# Patient Record
Sex: Female | Born: 1935 | Race: White | Hispanic: No | Smoking: Never smoker
Health system: Southern US, Community
[De-identification: ages and names within clinical notes are randomized; demographics above are authoritative.]

## PROBLEM LIST (undated history)

## (undated) DIAGNOSIS — I10 Essential (primary) hypertension: Secondary | ICD-10-CM

## (undated) DIAGNOSIS — C19 Malignant neoplasm of rectosigmoid junction: Secondary | ICD-10-CM

## (undated) DIAGNOSIS — C801 Malignant (primary) neoplasm, unspecified: Secondary | ICD-10-CM

## (undated) DIAGNOSIS — I639 Cerebral infarction, unspecified: Secondary | ICD-10-CM

## (undated) DIAGNOSIS — Z87442 Personal history of urinary calculi: Secondary | ICD-10-CM

## (undated) DIAGNOSIS — E785 Hyperlipidemia, unspecified: Secondary | ICD-10-CM

## (undated) HISTORY — PX: COLON SURGERY: SHX602

## (undated) HISTORY — PX: ABDOMINAL HYSTERECTOMY: SHX81

## (undated) HISTORY — PX: CHOLECYSTECTOMY: SHX55

---

## 2015-09-08 HISTORY — PX: COLOSTOMY: SHX63

## 2017-01-10 ENCOUNTER — Inpatient Hospital Stay
Admission: EM | Admit: 2017-01-10 | Discharge: 2017-01-14 | DRG: 872 | Disposition: A | Discharge status: Skilled Nursing Facility | Payer: Medicare Other | Attending: Internal Medicine | Admitting: Internal Medicine

## 2017-01-10 ENCOUNTER — Emergency Department: Payer: Medicare Other

## 2017-01-10 ENCOUNTER — Inpatient Hospital Stay: Payer: Medicare Other

## 2017-01-10 DIAGNOSIS — R7989 Other specified abnormal findings of blood chemistry: Secondary | ICD-10-CM

## 2017-01-10 DIAGNOSIS — Z79899 Other long term (current) drug therapy: Secondary | ICD-10-CM | POA: Diagnosis not present

## 2017-01-10 DIAGNOSIS — Z881 Allergy status to other antibiotic agents status: Secondary | ICD-10-CM

## 2017-01-10 DIAGNOSIS — I2699 Other pulmonary embolism without acute cor pulmonale: Secondary | ICD-10-CM

## 2017-01-10 DIAGNOSIS — N200 Calculus of kidney: Secondary | ICD-10-CM

## 2017-01-10 DIAGNOSIS — E785 Hyperlipidemia, unspecified: Secondary | ICD-10-CM | POA: Diagnosis present

## 2017-01-10 DIAGNOSIS — N136 Pyonephrosis: Secondary | ICD-10-CM | POA: Diagnosis present

## 2017-01-10 DIAGNOSIS — R4182 Altered mental status, unspecified: Secondary | ICD-10-CM | POA: Diagnosis present

## 2017-01-10 DIAGNOSIS — R000 Tachycardia, unspecified: Secondary | ICD-10-CM

## 2017-01-10 DIAGNOSIS — N132 Hydronephrosis with renal and ureteral calculous obstruction: Secondary | ICD-10-CM

## 2017-01-10 DIAGNOSIS — A419 Sepsis, unspecified organism: Secondary | ICD-10-CM

## 2017-01-10 DIAGNOSIS — I4891 Unspecified atrial fibrillation: Secondary | ICD-10-CM | POA: Diagnosis present

## 2017-01-10 DIAGNOSIS — A4159 Other Gram-negative sepsis: Principal | ICD-10-CM | POA: Diagnosis present

## 2017-01-10 DIAGNOSIS — N131 Hydronephrosis with ureteral stricture, not elsewhere classified: Secondary | ICD-10-CM

## 2017-01-10 DIAGNOSIS — N12 Tubulo-interstitial nephritis, not specified as acute or chronic: Secondary | ICD-10-CM

## 2017-01-10 DIAGNOSIS — N390 Urinary tract infection, site not specified: Secondary | ICD-10-CM | POA: Diagnosis present

## 2017-01-10 DIAGNOSIS — Z7982 Long term (current) use of aspirin: Secondary | ICD-10-CM

## 2017-01-10 DIAGNOSIS — Z85048 Personal history of other malignant neoplasm of rectum, rectosigmoid junction, and anus: Secondary | ICD-10-CM | POA: Diagnosis not present

## 2017-01-10 DIAGNOSIS — J9811 Atelectasis: Secondary | ICD-10-CM | POA: Diagnosis present

## 2017-01-10 DIAGNOSIS — Z8673 Personal history of transient ischemic attack (TIA), and cerebral infarction without residual deficits: Secondary | ICD-10-CM | POA: Diagnosis not present

## 2017-01-10 DIAGNOSIS — N10 Acute tubulo-interstitial nephritis: Secondary | ICD-10-CM

## 2017-01-10 DIAGNOSIS — R778 Other specified abnormalities of plasma proteins: Secondary | ICD-10-CM

## 2017-01-10 HISTORY — DX: Malignant neoplasm of rectosigmoid junction: C19

## 2017-01-10 HISTORY — DX: Malignant (primary) neoplasm, unspecified: C80.1

## 2017-01-10 HISTORY — PX: IR NEPHROSTOMY PLACEMENT RIGHT: IMG6064

## 2017-01-10 HISTORY — DX: Cerebral infarction, unspecified: I63.9

## 2017-01-10 LAB — URINALYSIS, COMPLETE (UACMP) WITH MICROSCOPIC
BILIRUBIN URINE: NEGATIVE
Glucose, UA: 50 mg/dL — AB
KETONES UR: NEGATIVE mg/dL
Nitrite: POSITIVE — AB
PROTEIN: 100 mg/dL — AB
SQUAMOUS EPITHELIAL / LPF: NONE SEEN
Specific Gravity, Urine: 1.019 (ref 1.005–1.030)
pH: 6 (ref 5.0–8.0)

## 2017-01-10 LAB — CBC WITH DIFFERENTIAL/PLATELET
BASOS PCT: 0 %
Basophils Absolute: 0 10*3/uL (ref 0–0.1)
Eosinophils Absolute: 0 10*3/uL (ref 0–0.7)
Eosinophils Relative: 0 %
HEMATOCRIT: 41.2 % (ref 35.0–47.0)
Hemoglobin: 13.7 g/dL (ref 12.0–16.0)
LYMPHS ABS: 0.9 10*3/uL — AB (ref 1.0–3.6)
LYMPHS PCT: 6 %
MCH: 30.5 pg (ref 26.0–34.0)
MCHC: 33.3 g/dL (ref 32.0–36.0)
MCV: 91.7 fL (ref 80.0–100.0)
MONO ABS: 1.1 10*3/uL — AB (ref 0.2–0.9)
MONOS PCT: 7 %
NEUTROS ABS: 14.7 10*3/uL — AB (ref 1.4–6.5)
Neutrophils Relative %: 87 %
Platelets: 140 10*3/uL — ABNORMAL LOW (ref 150–440)
RBC: 4.49 MIL/uL (ref 3.80–5.20)
RDW: 14.9 % — AB (ref 11.5–14.5)
WBC: 16.8 10*3/uL — ABNORMAL HIGH (ref 3.6–11.0)

## 2017-01-10 LAB — COMPREHENSIVE METABOLIC PANEL
ALBUMIN: 3.5 g/dL (ref 3.5–5.0)
ALT: 35 U/L (ref 14–54)
AST: 45 U/L — AB (ref 15–41)
Alkaline Phosphatase: 48 U/L (ref 38–126)
Anion gap: 8 (ref 5–15)
BUN: 28 mg/dL — AB (ref 6–20)
CHLORIDE: 101 mmol/L (ref 101–111)
CO2: 27 mmol/L (ref 22–32)
CREATININE: 1.17 mg/dL — AB (ref 0.44–1.00)
Calcium: 8.8 mg/dL — ABNORMAL LOW (ref 8.9–10.3)
GFR calc Af Amer: 49 mL/min — ABNORMAL LOW (ref >60)
GFR calc non Af Amer: 43 mL/min — ABNORMAL LOW (ref >60)
GLUCOSE: 170 mg/dL — AB (ref 65–99)
POTASSIUM: 3.9 mmol/L (ref 3.5–5.1)
SODIUM: 136 mmol/L (ref 135–145)
Total Bilirubin: 1.7 mg/dL — ABNORMAL HIGH (ref 0.3–1.2)
Total Protein: 7.2 g/dL (ref 6.5–8.1)

## 2017-01-10 LAB — PROTIME-INR
INR: 1.17
Prothrombin Time: 15 seconds (ref 11.4–15.2)

## 2017-01-10 LAB — TROPONIN I: TROPONIN I: 0.03 ng/mL — AB (ref <0.03)

## 2017-01-10 LAB — LIPASE, BLOOD: LIPASE: 13 U/L (ref 11–51)

## 2017-01-10 LAB — LACTIC ACID, PLASMA: LACTIC ACID, VENOUS: 2.6 mmol/L — AB (ref 0.5–1.9)

## 2017-01-10 LAB — PROCALCITONIN: Procalcitonin: 2.48 ng/mL

## 2017-01-10 MED ORDER — ACETAMINOPHEN 650 MG RE SUPP
650.0000 mg | Freq: Four times a day (QID) | RECTAL | Status: DC | PRN
  Filled 2017-01-10: qty 1

## 2017-01-10 MED ORDER — FERROUS SULFATE 325 (65 FE) MG PO TABS
325.0000 mg | ORAL_TABLET | Freq: Every day | ORAL | Status: DC
  Administered 2017-01-11 – 2017-01-14 (×4): 325 mg via ORAL
  Filled 2017-01-10 (×4): qty 1

## 2017-01-10 MED ORDER — ACETAMINOPHEN 325 MG PO TABS
650.0000 mg | ORAL_TABLET | Freq: Four times a day (QID) | ORAL | Status: DC | PRN
  Administered 2017-01-11: 18:00:00 650 mg via ORAL
  Filled 2017-01-10 (×2): qty 2

## 2017-01-10 MED ORDER — AZTREONAM 2 G IJ SOLR
2.0000 g | Freq: Once | INTRAMUSCULAR | Status: AC
  Administered 2017-01-10: 2 g via INTRAVENOUS
  Filled 2017-01-10: qty 2

## 2017-01-10 MED ORDER — ATORVASTATIN CALCIUM 20 MG PO TABS
80.0000 mg | ORAL_TABLET | Freq: Every day | ORAL | Status: DC
  Administered 2017-01-11 – 2017-01-13 (×3): 80 mg via ORAL
  Filled 2017-01-10 (×4): qty 4

## 2017-01-10 MED ORDER — ASPIRIN EC 81 MG PO TBEC
81.0000 mg | DELAYED_RELEASE_TABLET | Freq: Every day | ORAL | Status: DC
  Administered 2017-01-11 – 2017-01-14 (×4): 81 mg via ORAL
  Filled 2017-01-10 (×4): qty 1

## 2017-01-10 MED ORDER — LEVOFLOXACIN IN D5W 750 MG/150ML IV SOLN
750.0000 mg | INTRAVENOUS | Status: DC
  Administered 2017-01-12: 16:00:00 750 mg via INTRAVENOUS
  Filled 2017-01-10: qty 150

## 2017-01-10 MED ORDER — SODIUM CHLORIDE 0.9 % IV SOLN
INTRAVENOUS | Status: DC
  Administered 2017-01-11: 06:00:00 via INTRAVENOUS

## 2017-01-10 MED ORDER — ACETAMINOPHEN 500 MG PO TABS
1000.0000 mg | ORAL_TABLET | Freq: Once | ORAL | Status: AC
  Administered 2017-01-10: 650 mg via ORAL
  Administered 2017-01-10: 1000 mg via ORAL
  Filled 2017-01-10: qty 2

## 2017-01-10 MED ORDER — ONDANSETRON HCL 4 MG PO TABS: 4.0000 mg | ORAL_TABLET | Freq: Four times a day (QID) | ORAL | Status: DC | PRN

## 2017-01-10 MED ORDER — MIDAZOLAM HCL 5 MG/5ML IJ SOLN
INTRAMUSCULAR | Status: AC
  Filled 2017-01-10: qty 5

## 2017-01-10 MED ORDER — VANCOMYCIN HCL IN DEXTROSE 1-5 GM/200ML-% IV SOLN
1000.0000 mg | Freq: Once | INTRAVENOUS | Status: AC
  Administered 2017-01-10: 1000 mg via INTRAVENOUS
  Filled 2017-01-10: qty 200

## 2017-01-10 MED ORDER — FENTANYL CITRATE (PF) 100 MCG/2ML IJ SOLN
INTRAMUSCULAR | Status: AC
  Filled 2017-01-10: qty 2

## 2017-01-10 MED ORDER — LORATADINE 10 MG PO TABS
10.0000 mg | ORAL_TABLET | Freq: Every day | ORAL | Status: DC
  Administered 2017-01-11 – 2017-01-14 (×4): 10 mg via ORAL
  Filled 2017-01-10 (×4): qty 1

## 2017-01-10 MED ORDER — ONDANSETRON HCL 4 MG/2ML IJ SOLN
INTRAMUSCULAR | Status: AC | PRN
  Administered 2017-01-10: 4 mg via INTRAVENOUS

## 2017-01-10 MED ORDER — LEVOFLOXACIN IN D5W 750 MG/150ML IV SOLN
750.0000 mg | Freq: Once | INTRAVENOUS | Status: AC
  Administered 2017-01-10: 750 mg via INTRAVENOUS
  Filled 2017-01-10: qty 150

## 2017-01-10 MED ORDER — ONDANSETRON HCL 4 MG/2ML IJ SOLN
INTRAMUSCULAR | Status: AC
  Filled 2017-01-10: qty 2

## 2017-01-10 MED ORDER — SODIUM CHLORIDE 0.9 % IV BOLUS (SEPSIS)
1000.0000 mL | Freq: Once | INTRAVENOUS | Status: AC
  Administered 2017-01-10: 1000 mL via INTRAVENOUS

## 2017-01-10 MED ORDER — IOPAMIDOL (ISOVUE-300) INJECTION 61%
50.0000 mL | Freq: Once | INTRAVENOUS | Status: AC | PRN
  Administered 2017-01-10: 15 mL via INTRAVENOUS

## 2017-01-10 MED ORDER — FENTANYL CITRATE (PF) 100 MCG/2ML IJ SOLN
INTRAMUSCULAR | Status: AC | PRN
  Administered 2017-01-10: 25 ug via INTRAVENOUS

## 2017-01-10 MED ORDER — MIDAZOLAM HCL 2 MG/2ML IJ SOLN
INTRAMUSCULAR | Status: AC | PRN
  Administered 2017-01-10: 0.5 mg via INTRAVENOUS

## 2017-01-10 MED ORDER — ENOXAPARIN SODIUM 40 MG/0.4ML ~~LOC~~ SOLN
40.0000 mg | SUBCUTANEOUS | Status: DC
  Administered 2017-01-11 – 2017-01-13 (×3): 40 mg via SUBCUTANEOUS
  Filled 2017-01-10 (×3): qty 0.4

## 2017-01-10 MED ORDER — IOPAMIDOL (ISOVUE-300) INJECTION 61%
75.0000 mL | Freq: Once | INTRAVENOUS | Status: AC | PRN
  Administered 2017-01-10: 75 mL via INTRAVENOUS

## 2017-01-10 MED ORDER — MORPHINE SULFATE (PF) 2 MG/ML IV SOLN: 2.0000 mg | INTRAVENOUS | Status: DC | PRN

## 2017-01-10 MED ORDER — VITAMIN C 500 MG PO TABS
500.0000 mg | ORAL_TABLET | Freq: Every day | ORAL | Status: DC
  Administered 2017-01-11 – 2017-01-14 (×4): 500 mg via ORAL
  Filled 2017-01-10 (×4): qty 1

## 2017-01-10 MED ORDER — ONDANSETRON HCL 4 MG/2ML IJ SOLN
4.0000 mg | Freq: Four times a day (QID) | INTRAMUSCULAR | Status: DC | PRN
  Administered 2017-01-11: 4 mg via INTRAVENOUS
  Filled 2017-01-10: qty 2

## 2017-01-10 MED ORDER — ADULT MULTIVITAMIN W/MINERALS CH
1.0000 | ORAL_TABLET | Freq: Every day | ORAL | Status: DC
  Administered 2017-01-11 – 2017-01-14 (×4): 1 via ORAL
  Filled 2017-01-10 (×4): qty 1

## 2017-01-10 NOTE — Procedures (Signed)
Interventional Radiology Procedure Note  Procedure:  Right percutaneous nephrostomy  Complications: None  Estimated Blood Loss: < 10 mL  Overt pus in collecting system c/w pyonephrosis.  Sample sent for culture.  10 Fr PCN placed and formed in renal pelvis.  Connected to gravity bag drainage.  Venetia Night. Kathlene Cote, M.D Pager:  [phone]

## 2017-01-10 NOTE — ED Notes (Signed)
Patient taken to [facility] by this RN. Report given to vascular RN at bedside.

## 2017-01-10 NOTE — H&P (Signed)
[facility] [facility] [place] NAME: Megan Hamilton    MR#:  [id number]  DATE OF BIRTH:  01-04-1933  DATE OF ADMISSION:  01/10/2017  PRIMARY CARE PHYSICIAN: Verita Lamb, NP   REQUESTING/REFERRING PHYSICIAN: Dr. Rip Harbour  CHIEF COMPLAINT:   Chief Complaint  Patient presents with  . Altered Mental Status    HISTORY OF PRESENT ILLNESS:  Megan Hamilton  is a 83 y.o. female with a known history of Colorectal cancer, previous history of CVA, also the hospital brought in by her daughter due to altered mental status, lethargy. As per the daughter gives most of the history patient was in her usual state of health until yesterday she had 2 episodes of nausea vomiting. Patient rested most of the day yesterday and was quite lethargic, she was also very weak and lethargic today and therefore they were concerned and brought her to the ER for further evaluation. Patient was noted to be septic secondary to acute pyelonephritis and also noted to have acute right-sided nephrolithiasis. Hospitalist services were contacted further treatment and evaluation. Patient denies any flank abdominal pain, hematuria, chest pain, shortness of breath or any other associated symptoms presently.  PAST MEDICAL HISTORY:   Past Medical History:  Diagnosis Date  . Cancer [facility])    colorectal 2017  . Colorectal cancer ([place])   . Stroke [facility])     PAST SURGICAL HISTORY:   Past Surgical History:  Procedure Laterality Date  . ABDOMINAL HYSTERECTOMY    . CHOLECYSTECTOMY    . COLON SURGERY      SOCIAL HISTORY:   Social History  Substance Use Topics  . Smoking status: Never Smoker  . Smokeless tobacco: Never Used  . Alcohol use No    FAMILY HISTORY:   Family History  Problem Relation Age of Onset  . Diabetes Mother   . Stroke Father     DRUG ALLERGIES:   Allergies  Allergen Reactions  . Cephalexin Hives  . Quinine Derivatives     REVIEW OF SYSTEMS:   Review of  Systems  Constitutional: Positive for malaise/fatigue. Negative for fever and weight loss.  HENT: Negative for congestion, nosebleeds and tinnitus.   Eyes: Negative for blurred vision, double vision and redness.  Respiratory: Negative for cough, hemoptysis and shortness of breath.   Cardiovascular: Negative for chest pain, orthopnea, leg swelling and PND.  Gastrointestinal: Positive for nausea and vomiting. Negative for abdominal pain, diarrhea and melena.  Genitourinary: Negative for dysuria, hematuria and urgency.  Musculoskeletal: Negative for falls and joint pain.  Neurological: Positive for weakness. Negative for dizziness, tingling, sensory change, focal weakness, seizures and headaches.  Endo/Heme/Allergies: Negative for polydipsia. Does not bruise/bleed easily.  Psychiatric/Behavioral: Negative for depression and memory loss. The patient is not nervous/anxious.     MEDICATIONS AT HOME:   Prior to Admission medications   Medication Sig Start Date End Date Taking? Authorizing Provider  acetaminophen (TYLENOL) 500 MG tablet Take 1,000 mg by mouth every 6 (six) hours as needed for fever.   Yes [provider]  Ascorbic Acid (VITAMIN C WITH ROSE HIPS) 500 MG tablet Take 500 mg by mouth daily.   Yes [provider]  aspirin EC 81 MG tablet Take 81 mg by mouth daily.   Yes [provider]  atorvastatin (LIPITOR) 80 MG tablet Take 80 mg by mouth daily.   Yes [provider]  diphenhydramine-acetaminophen (TYLENOL PM) 25-500 MG TABS tablet Take 1 tablet by mouth at bedtime.  Yes [provider]  ferrous sulfate 325 (65 FE) MG tablet Take 325 mg by mouth daily with breakfast.   Yes [provider]  loratadine (CLARITIN) 10 MG tablet Take 10 mg by mouth daily.   Yes [provider]  Multiple Vitamin (MULTIVITAMIN) tablet Take 1 tablet by mouth daily.   Yes [provider]      VITAL SIGNS:  Blood pressure 125/67,  pulse 83, temperature 98.3 F (36.8 C), temperature source Oral, resp. rate 17, weight 88.9 kg (196 lb), SpO2 97 %.  PHYSICAL EXAMINATION:  Physical Exam  GENERAL:  83 y.o.-year-old patient lying in bed in no acute distress.  EYES: Pupils equal, round, reactive to light and accommodation. No scleral icterus. Extraocular muscles intact.  HEENT: Head atraumatic, normocephalic. Oropharynx and nasopharynx clear. No oropharyngeal erythema, moist oral mucosa  NECK:  Supple, no jugular venous distention. No thyroid enlargement, no tenderness.  LUNGS: Normal breath sounds bilaterally, no wheezing, rales, rhonchi. No use of accessory muscles of respiration.  CARDIOVASCULAR: S1, S2 RRR. II/VI SEM at LSB, No rubs, gallops, clicks.  ABDOMEN: Soft, nontender, nondistended. Bowel sounds present. No organomegaly or mass. Midabdominal scar with colostomy in place. EXTREMITIES: No pedal edema, cyanosis, or clubbing. + 2 pedal & radial pulses b/l.   NEUROLOGIC: Cranial nerves II through XII are intact. No focal Motor or sensory deficits appreciated b/l. Globally weak.  PSYCHIATRIC: The patient is alert and oriented x 3.  SKIN: No obvious rash, lesion, or ulcer.   LABORATORY PANEL:   CBC  Recent Labs Lab 01/10/17 1648  WBC 16.8*  HGB 13.7  HCT 41.2  PLT 140*   ------------------------------------------------------------------------------------------------------------------  Chemistries   Recent Labs Lab 01/10/17 1648  NA 136  K 3.9  CL 101  CO2 27  GLUCOSE 170*  BUN 28*  CREATININE 1.17*  CALCIUM 8.8*  AST 45*  ALT 35  ALKPHOS 48  BILITOT 1.7*   ------------------------------------------------------------------------------------------------------------------  Cardiac Enzymes  Recent Labs Lab 01/10/17 1701  TROPONINI 0.03*   ------------------------------------------------------------------------------------------------------------------  RADIOLOGY:  Dg Chest 1  View  Result Date: 01/10/2017 CLINICAL DATA:  Altered mental status.  Sepsis.  Colorectal cancer. EXAM: CHEST 1 VIEW COMPARISON:  None. FINDINGS: Right hemidiaphragm elevation. Mildly degraded exam due to AP portable technique and patient body habitus. The Chin overlies the apices. Patient rotated to the right. Cardiomegaly accentuated by AP portable technique. Atherosclerosis in the transverse aorta. No pleural effusion or pneumothorax. No congestive failure. Mild left base scarring laterally. IMPRESSION: No acute findings. Cardiomegaly without congestive failure. Low lung volumes. Aortic atherosclerosis. Electronically Signed   By: Abigail Miyamoto M.D.   On: 01/10/2017 17:17   Ct Abdomen Pelvis W Contrast  Result Date: 01/10/2017 CLINICAL DATA:  Acute onset of altered mental status. Abdominal sepsis. Initial encounter. EXAM: CT ABDOMEN AND PELVIS WITH CONTRAST TECHNIQUE: Multidetector CT imaging of the abdomen and pelvis was performed using the standard protocol following bolus administration of intravenous contrast. CONTRAST:  69mL ISOVUE-300 IOPAMIDOL (ISOVUE-300) INJECTION 61% COMPARISON:  None. FINDINGS: Lower chest: Minimal bibasilar atelectasis is noted. A tiny hiatal hernia is seen. The visualized portions of the mediastinum are grossly unremarkable. Hepatobiliary: The liver is unremarkable in appearance. The patient is status post cholecystectomy, with clips noted at the gallbladder fossa. The common bile duct remains normal in caliber. Vague soft tissue inflammation tracks about the inferior aspect of the liver, reflecting the adjacent right renal process. Pancreas: The pancreas is within normal limits. Spleen: The spleen is unremarkable in  appearance. Adrenals/Urinary Tract: The adrenal glands are grossly unremarkable in appearance. There is diffusely heterogeneous enhancement of the right kidney, with right renal enlargement and diffuse wall thickening along the right renal pelvis, compatible with  right-sided pyelonephritis. Surrounding stranding is noted, tracking inferiorly along the right paracolic gutter. A large 1.5 cm stone is noted at the right renal pelvis. There is diffuse wall thickening along the course of the right ureter, reflecting right-sided ureteritis. Minimal right-sided hydronephrosis is noted. The left kidney is grossly unremarkable in appearance. No additional renal or ureteral stones are identified. Stomach/Bowel: The appendix is not well seen. Vague soft tissue inflammation about the cecum appears to reflect the adjacent right ureteral process. The patient's left lower quadrant colostomy is grossly unremarkable in appearance, aside from herniation of a short segment of small bowel into the colostomy site. There is no evidence for bowel obstruction. Minimal diverticulosis is noted along the distal descending colon. Remaining visualized small bowel loops are grossly unremarkable in appearance. The stomach is decompressed and grossly unremarkable. Vascular/Lymphatic: Scattered calcification is seen along the abdominal aorta and its branches. The abdominal aorta is otherwise grossly unremarkable. The inferior vena cava is grossly unremarkable. No retroperitoneal lymphadenopathy is seen. No pelvic sidewall lymphadenopathy is identified. Reproductive: The patient is status post hysterectomy. There is diffuse soft tissue inflammation about the decompressed bladder, raising concern for cystitis. Vague nodular soft tissue density is seen within the presacral region, with associated trace free fluid, raising question for metastatic disease. Other: No additional soft tissue abnormalities are seen. Musculoskeletal: No acute osseous abnormalities are identified. Facet disease is noted at the lower lumbar spine. The visualized musculature is unremarkable in appearance. IMPRESSION: 1. Diffusely heterogeneous enhancement of the right kidney, with right renal enlargement and diffuse wall thickening  along the right renal pelvis, compatible with right-sided pyelonephritis. Large 1.5 cm stone at the right renal pelvis, with minimal right-sided hydronephrosis. Diffuse wall thickening along the right ureter, reflecting right-sided ureteritis. 2. Diffuse soft tissue inflammation about the decompressed bladder, raising concern for cystitis. 3. Vague nodular soft tissue density at the presacral region, with associated trace free fluid, raising question for metastatic disease. Would correlate with the patient's symptoms, and consider PET/CT for further evaluation, as deemed clinically appropriate. 4. Vague soft tissue inflammation tracks about the inferior aspect of the liver, with fluid tracking along the right paracolic gutter, likely reflecting the right renal process. 5. Tiny hiatal hernia noted. 6. Left lower quadrant colostomy is grossly unremarkable in appearance, aside from herniation of a short segment of small bowel into the colostomy site. No evidence of bowel obstruction. 7. Minimal diverticulosis at the distal descending colon, without evidence of diverticulitis. 8. Scattered aortic atherosclerosis. Electronically Signed   By: Garald Balding M.D.   On: 01/10/2017 19:06     IMPRESSION AND PLAN:   83 year old female with past medical history of colorectal cancer status post colostomy, history of previous CVA who presents to the hospital due to altered mental status, weakness lethargy and noted to have sepsis secondary to acute pyelonephritis.  1. Sepsis-patient meets criteria given her leukocytosis, tachycardia, elevated lactic acid and urinalysis positive for urinary tract infection. Source of patient's sepsis is acute pyelonephritis. -In the ER patient received IV aztreonam, Levaquin. I will just continue Levaquin for now. Follow urine, blood cultures. Follow hemodynamics.  2. Acute pyelonephritis-source of patient's sepsis. Secondary to right-sided nephrolithiasis. -Supportive care with IV  fluids, antibiotics, IV Levaquin. -Given the nephrolithiasis urology consult obtained and as per them  given the size of her stone she would benefit from percutaneous drainage rather than cystoscopy and extraction of stone given its large size. -Interventional radiology to plan for percutaneous drainage later tonight.  3. Leukocytosis-secondary to sepsis.  -follow with IV antibiotic therapy.  4. Hyperlipidemia-continue atorvastatin.  5. History of CVA-continue aspirin, statin.  6. History of colorectal cancer-currently in remission.  All the records are reviewed and case discussed with ED provider. Management plans discussed with the patient, family and they are in agreement.  CODE STATUS: Full  TOTAL TIME TAKING CARE OF THIS PATIENT: 45 minutes.    Henreitta Leber M.D on 01/10/2017 at 8:11 PM  Between 7am to 6pm - Pager - [phone]  After 6pm go to [email or web address] - password [email or web address] [facility]  [place] [facility]  Office  [phone]  CC: Primary care physician; Verita Lamb, NP

## 2017-01-10 NOTE — ED Notes (Signed)
Patient transported to CT 

## 2017-01-10 NOTE — Progress Notes (Signed)
Pharmacy Antibiotic Note  Megan Hamilton is a 83 y.o. female admitted on 01/10/2017 with UTI.  Pharmacy has been consulted for Levaquin dosing.  Plan: Levaquin 750 mg IV q 48 hours ordered.   Vancomycin and aztreonam consults d/c per admitting MD H&P.  Height: 5\' 6"  (167.6 cm) Weight: 196 lb (88.9 kg) IBW/kg (Calculated) : 59.3  Temp (24hrs), Avg:99.4 F (37.4 C), Min:98 F (36.7 C), Max:101.8 F (38.8 C)   Recent Labs Lab 01/10/17 1648  WBC 16.8*  CREATININE 1.17*  LATICACIDVEN 2.6*    Estimated Creatinine Clearance: 42.3 mL/min (A) (by C-G formula based on SCr of 1.17 mg/dL (H)).    Allergies  Allergen Reactions  . Cephalexin Hives  . Quinine Derivatives     Antimicrobials this admission: Vancomycin, aztreonam x1 Levaquin 5/6 >>    >>   Dose adjustments this admission:   Microbiology results: 5/6 BCx: pending 5/6 UCx: pending       5/6 UA: LE(+) NO2(+) WBC TNTC 5/6 CXR: no acute disease  Thank you for allowing pharmacy to be a part of this patient's care.  Shalinda Burkholder S 01/10/2017 11:53 PM

## 2017-01-10 NOTE — Sedation Documentation (Signed)
Notified MD of temp 101.8  Telephone order to give tylenol 650 mg given and done

## 2017-01-10 NOTE — ED Provider Notes (Signed)
[facility] Emergency Department Provider Note  ____________________________________________   First MD Initiated Contact with Patient 01/10/17 1658     (approximate)  I have reviewed the triage vital signs and the nursing notes.   HISTORY  Chief Complaint Altered Mental Status  Level V exemption history Limited by the patient's clinical condition  HPI Megan Hamilton is a 83 y.o. female whose family brings her to the emergency department for altered mental status nausea fatigue and diaphoresis for the past 24 hours or so. She's had a bit of a dry cough. She's been nauseated and has not had an appetite eating last half a cup of grits this morning for breakfast. She's had no diarrhea. She is a past medical history ofcolon cancer and is status post resection with a colostomy in place. She denies chest pain. She denies rhinorrhea or sore throat. She is passing flatus. She has never had a kidney stone before.   Past Medical History:  Diagnosis Date  . Cancer [facility])    colorectal 2017  . Stroke [facility])     There are no active problems to display for this patient.   Past Surgical History:  Procedure Laterality Date  . ABDOMINAL HYSTERECTOMY    . CHOLECYSTECTOMY    . COLON SURGERY      Prior to Admission medications   Medication Sig Start Date End Date Taking? Authorizing Provider  acetaminophen (TYLENOL) 500 MG tablet Take 1,000 mg by mouth every 6 (six) hours as needed for fever.   Yes [provider]  Ascorbic Acid (VITAMIN C WITH ROSE HIPS) 500 MG tablet Take 500 mg by mouth daily.   Yes [provider]  aspirin EC 81 MG tablet Take 81 mg by mouth daily.   Yes [provider]  atorvastatin (LIPITOR) 80 MG tablet Take 80 mg by mouth daily.   Yes [provider]  diphenhydramine-acetaminophen (TYLENOL PM) 25-500 MG TABS tablet Take 1 tablet by mouth at bedtime.   Yes [provider]  ferrous sulfate 325 (65  FE) MG tablet Take 325 mg by mouth daily with breakfast.   Yes [provider]  loratadine (CLARITIN) 10 MG tablet Take 10 mg by mouth daily.   Yes [provider]  Multiple Vitamin (MULTIVITAMIN) tablet Take 1 tablet by mouth daily.   Yes [provider]    Allergies Cephalexin and Quinine derivatives  No family history on file.  Social History Social History  Substance Use Topics  . Smoking status: Never Smoker  . Smokeless tobacco: Never Used  . Alcohol use Not on file    Review of Systems Level V exemption history Limited by the patient's clinical condition 10-point ROS otherwise negative.  ____________________________________________   PHYSICAL EXAM:  VITAL SIGNS: ED Triage Vitals  Enc Vitals Group     BP 01/10/17 1639 (!) 156/67     Pulse Rate 01/10/17 1639 88     Resp 01/10/17 1639 20     Temp 01/10/17 1639 (!) 101.3 F (38.5 C)     Temp Source 01/10/17 1639 Oral     SpO2 01/10/17 1639 93 %     Weight 01/10/17 1636 150 lb (68 kg)     Height --      Head Circumference --      Peak Flow --      Pain Score 01/10/17 1636 0     Pain Loc --      Pain Edu? --  Excl. in [place]? --     Constitutional: Confused diaphoretic ill-appearing with elevated respiratory rate Eyes: PERRL EOMI. Head: Atraumatic. Nose: No congestion/rhinnorhea. Mouth/Throat: No trismus Neck: No stridor.   Cardiovascular: Normal rate, regular rhythm. Grossly normal heart sounds.  Good peripheral circulation. Respiratory: Normal respiratory effort.  No retractions. Lungs CTAB and moving good air Gastrointestinal: Obese soft mild diffuse tenderness without focality stoma in place pink productive patent No costovertebral tenderness Musculoskeletal: No lower extremity edema   Neurologic:  Normal speech and language. No gross focal neurologic deficits are appreciated. Skin:  Pale and diaphoretic.     ____________________________________________    DIFFERENTIAL  Sepsis, pyelonephritis, urinary tract infection, bacteremia, pneumonia, abdominal catastrophe ____________________________________________   LABS (all labs ordered are listed, but only abnormal results are displayed)  Labs Reviewed  COMPREHENSIVE METABOLIC PANEL - Abnormal; Notable for the following:       Result Value   Glucose, Bld 170 (*)    BUN 28 (*)    Creatinine, Ser 1.17 (*)    Calcium 8.8 (*)    AST 45 (*)    Total Bilirubin 1.7 (*)    GFR calc non Af Amer 43 (*)    GFR calc Af Amer 49 (*)    All other components within normal limits  LACTIC ACID, PLASMA - Abnormal; Notable for the following:    Lactic Acid, Venous 2.6 (*)    All other components within normal limits  CBC WITH DIFFERENTIAL/PLATELET - Abnormal; Notable for the following:    WBC 16.8 (*)    RDW 14.9 (*)    Platelets 140 (*)    Neutro Abs 14.7 (*)    Lymphs Abs 0.9 (*)    Monocytes Absolute 1.1 (*)    All other components within normal limits  URINALYSIS, COMPLETE (UACMP) WITH MICROSCOPIC - Abnormal; Notable for the following:    Color, Urine AMBER (*)    APPearance CLOUDY (*)    Glucose, UA 50 (*)    Hgb urine dipstick SMALL (*)    Protein, ur 100 (*)    Nitrite POSITIVE (*)    Leukocytes, UA LARGE (*)    Bacteria, UA MANY (*)    All other components within normal limits  TROPONIN I - Abnormal; Notable for the following:    Troponin I 0.03 (*)    All other components within normal limits  CULTURE, BLOOD (ROUTINE X 2)  CULTURE, BLOOD (ROUTINE X 2)  URINE CULTURE  PROTIME-INR  LIPASE, BLOOD  PROCALCITONIN  LACTIC ACID, PLASMA    Elevated white count and pro-calcitonin consistent with infection elevated troponin likely secondary to type II non-ST elevation myocardial infarction and not true myocardial ischemia __________________________________________  EKG  ED ECG REPORT I, Darel Hong, the attending physician, personally viewed and interpreted this ECG.  Date:  01/10/2017 Rate: 88 Rhythm: normal sinus rhythm QRS Axis: normal Intervals: normal ST/T Wave abnormalities: normal Conduction Disturbances: none Narrative Interpretation: unremarkable  ____________________________________________  RADIOLOGY  Chest x-ray with no acute disease CT scan shows right-sided pyelonephritis with a large R Schlueter obstructive stone ____________________________________________   PROCEDURES  Procedure(s) performed: no  Procedures  Critical Care performed: yes  CRITICAL CARE Performed by: Darel Hong   Total critical care time: 35 minutes  Critical care time was exclusive of separately billable procedures and treating other patients.  Critical care was necessary to treat or prevent imminent or life-threatening deterioration.  Critical care was time spent personally by me on the following activities: development of treatment  plan with patient and/or surrogate as well as nursing, discussions with consultants, evaluation of patient's response to treatment, examination of patient, obtaining history from patient or surrogate, ordering and performing treatments and interventions, ordering and review of laboratory studies, ordering and review of radiographic studies, pulse oximetry and re-evaluation of patient's condition.   Observation: no ____________________________________________   INITIAL IMPRESSION / ASSESSMENT AND PLAN / ED COURSE  Pertinent labs & imaging results that were available during my care of the patient were reviewed by me and considered in my medical decision making (see chart for details).  On arrival the patient is altered, diaphoretic, and ill appearing. She is febrile and I'm concerned that she is septic without a clear source so I will treat her according to her our antibiogram. She has allergies to cephalosporins.     ----------------------------------------- 7:13 PM on  01/10/2017 -----------------------------------------  The patient has defervesced and clinically she appears improved. Her urinalysis is consistent with infection in CT scan shows right-sided pyelonephritis along with a 1.5 cm partially obstructive stone. I have a call out to urology now. ____________________________________________  ----------------------------------------- 7:20 PM on 01/10/2017 -----------------------------------------  I discussed the case with Dr. Erlene Quan on call for urology who will review the films now. She will determine whether or not a stone this large is amenable for her to remove versus interventional radiology for percutaneous drainage. Regardless at this point the patient requires inpatient admission for IV antibiotics and hydration. Clinically she is improving.  FINAL CLINICAL IMPRESSION(S) / ED DIAGNOSES  Final diagnoses:  Sepsis, due to unspecified organism [facility])  Pyelonephritis  Kidney stone      NEW MEDICATIONS STARTED DURING THIS VISIT:  New Prescriptions   No medications on file     Note:  This document was prepared using Dragon voice recognition software and may include unintentional dictation errors.     Darel Hong, MD 01/10/17 Sharilyn Sites

## 2017-01-10 NOTE — Consult Note (Signed)
Urology Consult  I have been asked to see the patient by Dr. Cammy Brochure, for evaluation and management of obstructing right ureteral stone.  Chief Complaint: Altered mental status, nausea and vomiting  History of Present Illness: Megan Hamilton is a 83 y.o. year old presenting to the emergency room with altered mental status. History of this provided today by her daughter who notes that yesterday evening, she developed nausea and vomiting with difficulty taken any fluids. This morning, the nausea vomiting continued and she became disoriented and confused. In the emergency room, she had a fever to 101.3, elevated lactate, WBC 17, positive UA, and a CT scan demonstrating an obstructing 1.5 cm right proximal ureteral stone with diffusely heterogeneous enhancement, renal pelvic and proximal ureteral wall thickening, and fluid tracking along her colic gutter. There is minimal right-sided hydronephrosis.  She has received Levaquin, vancomycin, and Aztreonam thus far in the emergency room.  She denies a personal history of kidney stones. She has no flank pain but the daughter states that she generally does not complain of pain whatsoever even after surgery.  She has has several UTIs since her colostomy surgery.  Past medical history significant for rectal cancer status post neoadjuvant radiation status post resection and colostomy, and stroke 2 in 2017.  Past Medical History:  Diagnosis Date  . Cancer [facility])    colorectal 2017  . Colorectal cancer ([place])   . Stroke [facility])     Past Surgical History:  Procedure Laterality Date  . ABDOMINAL HYSTERECTOMY    . CHOLECYSTECTOMY    . COLON SURGERY      Home Medications:  Current Meds  Medication Sig  . acetaminophen (TYLENOL) 500 MG tablet Take 1,000 mg by mouth every 6 (six) hours as needed for fever.  . Ascorbic Acid (VITAMIN C WITH ROSE HIPS) 500 MG tablet Take 500 mg by mouth daily.  Marland Kitchen aspirin EC 81 MG tablet Take 81 mg by  mouth daily.  Marland Kitchen atorvastatin (LIPITOR) 80 MG tablet Take 80 mg by mouth daily.  . diphenhydramine-acetaminophen (TYLENOL PM) 25-500 MG TABS tablet Take 1 tablet by mouth at bedtime.  . ferrous sulfate 325 (65 FE) MG tablet Take 325 mg by mouth daily with breakfast.  . loratadine (CLARITIN) 10 MG tablet Take 10 mg by mouth daily.  . Multiple Vitamin (MULTIVITAMIN) tablet Take 1 tablet by mouth daily.    Allergies:  Allergies  Allergen Reactions  . Cephalexin Hives  . Quinine Derivatives     Family History  Problem Relation Age of Onset  . Diabetes Mother   . Stroke Father     Social History:  reports that she has never smoked. She has never used smokeless tobacco. She reports that she does not drink alcohol or use drugs.  ROS: Patient was unable to perform review of systems given her current mental status.  Physical Exam:  Vital signs in last 24 hours: Temp:  [98.3 F (36.8 C)-101.3 F (38.5 C)] 98.3 F (36.8 C) (05/06 1815) Pulse Rate:  [83-95] 83 (05/06 1930) Resp:  [15-20] 17 (05/06 1930) BP: (125-157)/(60-78) 125/67 (05/06 1930) SpO2:  [93 %-97 %] 97 % (05/06 1930) Weight:  [150 lb (68 kg)-196 lb (88.9 kg)] 196 lb (88.9 kg) (05/06 1705) Constitutional:  Pale, ill-appearing, minimally conversive, daughter at bedside HEENT: [place] AT, moist mucus membranes.  Trachea midline, no masses Cardiovascular: Regular rate and rhythm, no clubbing, cyanosis, or edema. Respiratory: Normal respiratory effort, lungs clear bilaterally GI: Abdomen is  soft, nontender, colostomy in place with scant stool in bag. GU: No CVA tenderness Skin: No rashes, bruises or suspicious lesions Neurologic: Grossly intact, no focal deficits, moving all 4 extremities Psychiatric: Confused   Laboratory Data:   Recent Labs  01/10/17 1648  WBC 16.8*  HGB 13.7  HCT 41.2    Recent Labs  01/10/17 1648  NA 136  K 3.9  CL 101  CO2 27  GLUCOSE 170*  BUN 28*  CREATININE 1.17*  CALCIUM 8.8*     Recent Labs  01/10/17 1648  INR 1.17   Component     Latest Ref Rng & Units 01/10/2017  Color, Urine     YELLOW AMBER (A)  Appearance     CLEAR CLOUDY (A)  Specific Gravity, Urine     1.005 - 1.030 1.019  pH     5.0 - 8.0 6.0  Glucose     NEGATIVE mg/dL 50 (A)  Hgb urine dipstick     NEGATIVE SMALL (A)  Bilirubin Urine     NEGATIVE NEGATIVE  Ketones, ur     NEGATIVE mg/dL NEGATIVE  Protein     NEGATIVE mg/dL 100 (A)  Nitrite     NEGATIVE POSITIVE (A)  Leukocytes, UA     NEGATIVE LARGE (A)  RBC / HPF     0 - 5 RBC/hpf TOO NUMEROUS TO COUNT  WBC, UA     0 - 5 WBC/hpf TOO NUMEROUS TO COUNT  Bacteria, UA     NONE SEEN MANY (A)  Squamous Epithelial / LPF     NONE SEEN NONE SEEN  WBC Clumps      PRESENT  Mucous      PRESENT    Radiologic Imaging: Dg Chest 1 View  Result Date: 01/10/2017 CLINICAL DATA:  Altered mental status.  Sepsis.  Colorectal cancer. EXAM: CHEST 1 VIEW COMPARISON:  None. FINDINGS: Right hemidiaphragm elevation. Mildly degraded exam due to AP portable technique and patient body habitus. The Chin overlies the apices. Patient rotated to the right. Cardiomegaly accentuated by AP portable technique. Atherosclerosis in the transverse aorta. No pleural effusion or pneumothorax. No congestive failure. Mild left base scarring laterally. IMPRESSION: No acute findings. Cardiomegaly without congestive failure. Low lung volumes. Aortic atherosclerosis. Electronically Signed   By: Abigail Miyamoto M.D.   On: 01/10/2017 17:17   Ct Abdomen Pelvis W Contrast  Result Date: 01/10/2017 CLINICAL DATA:  Acute onset of altered mental status. Abdominal sepsis. Initial encounter. EXAM: CT ABDOMEN AND PELVIS WITH CONTRAST TECHNIQUE: Multidetector CT imaging of the abdomen and pelvis was performed using the standard protocol following bolus administration of intravenous contrast. CONTRAST:  61mL ISOVUE-300 IOPAMIDOL (ISOVUE-300) INJECTION 61% COMPARISON:  None. FINDINGS: Lower  chest: Minimal bibasilar atelectasis is noted. A tiny hiatal hernia is seen. The visualized portions of the mediastinum are grossly unremarkable. Hepatobiliary: The liver is unremarkable in appearance. The patient is status post cholecystectomy, with clips noted at the gallbladder fossa. The common bile duct remains normal in caliber. Vague soft tissue inflammation tracks about the inferior aspect of the liver, reflecting the adjacent right renal process. Pancreas: The pancreas is within normal limits. Spleen: The spleen is unremarkable in appearance. Adrenals/Urinary Tract: The adrenal glands are grossly unremarkable in appearance. There is diffusely heterogeneous enhancement of the right kidney, with right renal enlargement and diffuse wall thickening along the right renal pelvis, compatible with right-sided pyelonephritis. Surrounding stranding is noted, tracking inferiorly along the right paracolic gutter. A large 1.5 cm stone is  noted at the right renal pelvis. There is diffuse wall thickening along the course of the right ureter, reflecting right-sided ureteritis. Minimal right-sided hydronephrosis is noted. The left kidney is grossly unremarkable in appearance. No additional renal or ureteral stones are identified. Stomach/Bowel: The appendix is not well seen. Vague soft tissue inflammation about the cecum appears to reflect the adjacent right ureteral process. The patient's left lower quadrant colostomy is grossly unremarkable in appearance, aside from herniation of a short segment of small bowel into the colostomy site. There is no evidence for bowel obstruction. Minimal diverticulosis is noted along the distal descending colon. Remaining visualized small bowel loops are grossly unremarkable in appearance. The stomach is decompressed and grossly unremarkable. Vascular/Lymphatic: Scattered calcification is seen along the abdominal aorta and its branches. The abdominal aorta is otherwise grossly unremarkable.  The inferior vena cava is grossly unremarkable. No retroperitoneal lymphadenopathy is seen. No pelvic sidewall lymphadenopathy is identified. Reproductive: The patient is status post hysterectomy. There is diffuse soft tissue inflammation about the decompressed bladder, raising concern for cystitis. Vague nodular soft tissue density is seen within the presacral region, with associated trace free fluid, raising question for metastatic disease. Other: No additional soft tissue abnormalities are seen. Musculoskeletal: No acute osseous abnormalities are identified. Facet disease is noted at the lower lumbar spine. The visualized musculature is unremarkable in appearance. IMPRESSION: 1. Diffusely heterogeneous enhancement of the right kidney, with right renal enlargement and diffuse wall thickening along the right renal pelvis, compatible with right-sided pyelonephritis. Large 1.5 cm stone at the right renal pelvis, with minimal right-sided hydronephrosis. Diffuse wall thickening along the right ureter, reflecting right-sided ureteritis. 2. Diffuse soft tissue inflammation about the decompressed bladder, raising concern for cystitis. 3. Vague nodular soft tissue density at the presacral region, with associated trace free fluid, raising question for metastatic disease. Would correlate with the patient's symptoms, and consider PET/CT for further evaluation, as deemed clinically appropriate. 4. Vague soft tissue inflammation tracks about the inferior aspect of the liver, with fluid tracking along the right paracolic gutter, likely reflecting the right renal process. 5. Tiny hiatal hernia noted. 6. Left lower quadrant colostomy is grossly unremarkable in appearance, aside from herniation of a short segment of small bowel into the colostomy site. No evidence of bowel obstruction. 7. Minimal diverticulosis at the distal descending colon, without evidence of diverticulitis. 8. Scattered aortic atherosclerosis. Electronically  Signed   By: Garald Balding M.D.   On: 01/10/2017 19:06    Assessment/ Plan:  83 year old female admitted with altered mental status secondary to abscess of a urinary source with a 1.5 cm right obstructing proximal ureteral calculus.  1. Right proximal ureteral stone- CT scan images were reviewed personally today. Based on the size and location of the stone and its somewhat impacted appearance, I do have concern that ureteral stent placement would be difficult.  In order to expedite care, I reached out to interventional radiology, Dr. Aletta Edouard, who has agreed to place a right percutaneous nephrostomy tube. This was discussed in detail with the patient's family and all questions were answered.  Discussed delayed definitive treatment of the stone in 2-4 weeks once urinary tract infection has been adequately treated.  2. Sepsis of urinary source- recommend broad-spectrum antibiotics, but just as needed based on urine culture and sensitivity data. Would recommend Foley catheter placement for maximal decompression until patient's resent improve clinically.  01/10/2017, 8:21 PM  Hollice Espy,  MD   Thank you for involving me in this patient's  care, I will continue to follow along. Please page with any further questions or concerns.

## 2017-01-10 NOTE — ED Notes (Signed)
Patient's oxygen saturation decreased to 92% on RA. Pt placed on 1L [place]. Pt's oxygen increased to 94% on 1L [place]. MD R[place]bark notified. RN will continue to monitor.

## 2017-01-10 NOTE — ED Triage Notes (Addendum)
Pt bib EMS from home w/ AMS. Pt normally A/OX4 and ambulatory w/ walker.  Pt currently alert, will anser yes/no, able to move limbs on command w/ effort. Pt denies n/v and breathing difficulty.

## 2017-01-10 NOTE — Progress Notes (Signed)
Interventional Radiology:  83yo with obstructing right pelvic calculus and urosepsis in need of emergent percutaneous nephrostomy.  Discussed with Dr. Erlene Quan.  Discussed procedure with patient's son and daughter.  Risks and benefits discussed with the patient including, but not limited to infection, bleeding, significant bleeding causing loss or decrease in renal function or damage to adjacent structures. All of the patient's questions were answered, patient is agreeable to proceed. Consent signed and in chart.  Venetia Night. Kathlene Cote, M.D Pager:  [phone]

## 2017-01-10 NOTE — ED Notes (Signed)
Patient's clothing, purse, silver colored watch with brown band, upper and lower dentures given to patient's daughter.

## 2017-01-10 NOTE — ED Notes (Signed)
Pts family at bedside, sts that pt acting "out of it', similar to when she has had UTI's in past.

## 2017-01-11 LAB — BASIC METABOLIC PANEL
Anion gap: 8 (ref 5–15)
BUN: 28 mg/dL — AB (ref 6–20)
CHLORIDE: 104 mmol/L (ref 101–111)
CO2: 23 mmol/L (ref 22–32)
Calcium: 8 mg/dL — ABNORMAL LOW (ref 8.9–10.3)
Creatinine, Ser: 1.07 mg/dL — ABNORMAL HIGH (ref 0.44–1.00)
GFR calc Af Amer: 55 mL/min — ABNORMAL LOW (ref >60)
GFR, EST NON AFRICAN AMERICAN: 47 mL/min — AB (ref >60)
Glucose, Bld: 171 mg/dL — ABNORMAL HIGH (ref 65–99)
POTASSIUM: 3.9 mmol/L (ref 3.5–5.1)
Sodium: 135 mmol/L (ref 135–145)

## 2017-01-11 LAB — BLOOD CULTURE ID PANEL (REFLEXED)
ACINETOBACTER BAUMANNII: NOT DETECTED
CANDIDA KRUSEI: NOT DETECTED
CARBAPENEM RESISTANCE: NOT DETECTED
Candida albicans: NOT DETECTED
Candida glabrata: NOT DETECTED
Candida parapsilosis: NOT DETECTED
Candida tropicalis: NOT DETECTED
ENTEROBACTER CLOACAE COMPLEX: NOT DETECTED
ENTEROBACTERIACEAE SPECIES: DETECTED — AB
ENTEROCOCCUS SPECIES: NOT DETECTED
ESCHERICHIA COLI: NOT DETECTED
Haemophilus influenzae: NOT DETECTED
KLEBSIELLA OXYTOCA: NOT DETECTED
Klebsiella pneumoniae: NOT DETECTED
LISTERIA MONOCYTOGENES: NOT DETECTED
Neisseria meningitidis: NOT DETECTED
Proteus species: NOT DETECTED
Pseudomonas aeruginosa: NOT DETECTED
STAPHYLOCOCCUS AUREUS BCID: NOT DETECTED
STREPTOCOCCUS PNEUMONIAE: NOT DETECTED
STREPTOCOCCUS PYOGENES: NOT DETECTED
Serratia marcescens: NOT DETECTED
Staphylococcus species: NOT DETECTED
Streptococcus agalactiae: NOT DETECTED
Streptococcus species: NOT DETECTED

## 2017-01-11 LAB — LACTIC ACID, PLASMA
LACTIC ACID, VENOUS: 2.5 mmol/L — AB (ref 0.5–1.9)
LACTIC ACID, VENOUS: 2.7 mmol/L — AB (ref 0.5–1.9)
Lactic Acid, Venous: 2.7 mmol/L (ref 0.5–1.9)
Lactic Acid, Venous: 2.3 mmol/L (ref 0.5–1.9)

## 2017-01-11 LAB — CBC
HEMATOCRIT: 35.4 % (ref 35.0–47.0)
HEMOGLOBIN: 11.9 g/dL — AB (ref 12.0–16.0)
MCH: 30.9 pg (ref 26.0–34.0)
MCHC: 33.7 g/dL (ref 32.0–36.0)
MCV: 91.6 fL (ref 80.0–100.0)
Platelets: 100 10*3/uL — ABNORMAL LOW (ref 150–440)
RBC: 3.87 MIL/uL (ref 3.80–5.20)
RDW: 15.3 % — ABNORMAL HIGH (ref 11.5–14.5)
WBC: 13.6 10*3/uL — AB (ref 3.6–11.0)

## 2017-01-11 MED ORDER — DOCUSATE SODIUM 100 MG PO CAPS
100.0000 mg | ORAL_CAPSULE | Freq: Every day | ORAL | Status: DC
  Administered 2017-01-11 – 2017-01-14 (×4): 100 mg via ORAL
  Filled 2017-01-11 (×4): qty 1

## 2017-01-11 MED ORDER — SODIUM CHLORIDE 0.9 % IV BOLUS (SEPSIS)
500.0000 mL | Freq: Once | INTRAVENOUS | Status: AC
  Administered 2017-01-11: 05:00:00 500 mL via INTRAVENOUS

## 2017-01-11 MED ORDER — SODIUM CHLORIDE 0.9 % IV SOLN
INTRAVENOUS | Status: DC
  Administered 2017-01-11 (×2): via INTRAVENOUS

## 2017-01-11 MED ORDER — TRAMADOL HCL 50 MG PO TABS: 50.0000 mg | ORAL_TABLET | Freq: Four times a day (QID) | ORAL | Status: DC | PRN

## 2017-01-11 NOTE — Plan of Care (Signed)
Problem: Education: Goal: Knowledge of [facility] Education information/materials will improve Outcome: Progressing Pt likes to be called [place] Headings  Past Medical History:  Diagnosis Date  . Cancer [facility])    colorectal 2017  . Colorectal cancer ([place])   . Stroke [facility])    Past Surgical History:  Procedure Laterality Date  . ABDOMINAL HYSTERECTOMY    . CHOLECYSTECTOMY    . COLON SURGERY     Pt is well controlled with home medications

## 2017-01-11 NOTE — Progress Notes (Signed)
[place] [facility] [place] NAME: Megan Hamilton    MR#:  [id number]  DATE OF BIRTH:  May 19, 1933  SUBJECTIVE:   Patient presented with altered mental status with nausea and vomiting and found to have pyelonephritis  and  rightureteral stone.  She is status post right percutaneous nephrostomy tube REVIEW OF SYSTEMS:    Review of Systems  Constitutional: Negative for fever, chills weight loss HENT: Negative for ear pain, nosebleeds, congestion, facial swelling, rhinorrhea, neck pain, neck stiffness and ear discharge.   Respiratory: Negative for cough, shortness of breath, wheezing  Cardiovascular: Negative for chest pain, palpitations and leg swelling.  Gastrointestinal: Negative for heartburn, abdominal pain, vomiting, diarrhea or consitpation Genitourinary: Negative for dysuria, urgency, frequency, hematuria Musculoskeletal: Negative for back pain or joint pain Neurological: Negative for dizziness, seizures, syncope, focal weakness,  numbness and headaches.  Hematological: Does not bruise/bleed easily.  Psychiatric/Behavioral: Negative for hallucinations, dysphoric mood  Some confusion  Tolerating Diet: npo      DRUG ALLERGIES:   Allergies  Allergen Reactions  . Cephalexin Hives  . Quinine Derivatives     VITALS:  Blood pressure (!) 146/65, pulse 91, temperature 98.1 F (36.7 C), temperature source Oral, resp. rate 18, height 5\' 6"  (1.676 m), weight 88.9 kg (196 lb), SpO2 98 %.  PHYSICAL EXAMINATION:  Constitutional: Appears well-developed and well-nourished. No distress. HENT: Normocephalic. Marland Kitchen Oropharynx is clear and moist.  Eyes: Conjunctivae and EOM are normal. PERRLA, no scleral icterus.  Neck: Normal ROM. Neck supple. No JVD. No tracheal deviation. CVS: RRR, S1/S2 +, no murmurs, no gallops, no carotid bruit.  Pulmonary: Effort and breath sounds normal, no stridor, rhonchi, wheezes, rales.  Abdominal: Soft. BS +,  no  distension, tenderness, rebound or guarding.  Has nephrostomy tube right side  [place] colostomy left side  Musculoskeletal: Normal range of motion. No edema and no tenderness.  Neuro: Alert. CN 2-12 grossly intact. No focal deficits. Skin: Skin is warm and dry. No rash noted. Psychiatric: Normal mood and affect.      LABORATORY PANEL:   CBC  Recent Labs Lab 01/11/17 0030  WBC 13.6*  HGB 11.9*  HCT 35.4  PLT 100*   ------------------------------------------------------------------------------------------------------------------  Chemistries   Recent Labs Lab 01/10/17 1648 01/11/17 0030  NA 136 135  K 3.9 3.9  CL 101 104  CO2 27 23  GLUCOSE 170* 171*  BUN 28* 28*  CREATININE 1.17* 1.07*  CALCIUM 8.8* 8.0*  AST 45*  --   ALT 35  --   ALKPHOS 48  --   BILITOT 1.7*  --    ------------------------------------------------------------------------------------------------------------------  Cardiac Enzymes  Recent Labs Lab 01/10/17 1701  TROPONINI 0.03*   ------------------------------------------------------------------------------------------------------------------  RADIOLOGY:  Dg Chest 1 View  Result Date: 01/10/2017 CLINICAL DATA:  Altered mental status.  Sepsis.  Colorectal cancer. EXAM: CHEST 1 VIEW COMPARISON:  None. FINDINGS: Right hemidiaphragm elevation. Mildly degraded exam due to AP portable technique and patient body habitus. The Chin overlies the apices. Patient rotated to the right. Cardiomegaly accentuated by AP portable technique. Atherosclerosis in the transverse aorta. No pleural effusion or pneumothorax. No congestive failure. Mild left base scarring laterally. IMPRESSION: No acute findings. Cardiomegaly without congestive failure. Low lung volumes. Aortic atherosclerosis. Electronically Signed   By: Abigail Miyamoto M.D.   On: 01/10/2017 17:17   Ct Abdomen Pelvis W Contrast  Result Date: 01/10/2017 CLINICAL DATA:  Acute onset of altered mental status.  Abdominal sepsis. Initial encounter. EXAM: CT  ABDOMEN AND PELVIS WITH CONTRAST TECHNIQUE: Multidetector CT imaging of the abdomen and pelvis was performed using the standard protocol following bolus administration of intravenous contrast. CONTRAST:  56mL ISOVUE-300 IOPAMIDOL (ISOVUE-300) INJECTION 61% COMPARISON:  None. FINDINGS: Lower chest: Minimal bibasilar atelectasis is noted. A tiny hiatal hernia is seen. The visualized portions of the mediastinum are grossly unremarkable. Hepatobiliary: The liver is unremarkable in appearance. The patient is status post cholecystectomy, with clips noted at the gallbladder fossa. The common bile duct remains normal in caliber. Vague soft tissue inflammation tracks about the inferior aspect of the liver, reflecting the adjacent right renal process. Pancreas: The pancreas is within normal limits. Spleen: The spleen is unremarkable in appearance. Adrenals/Urinary Tract: The adrenal glands are grossly unremarkable in appearance. There is diffusely heterogeneous enhancement of the right kidney, with right renal enlargement and diffuse wall thickening along the right renal pelvis, compatible with right-sided pyelonephritis. Surrounding stranding is noted, tracking inferiorly along the right paracolic gutter. A large 1.5 cm stone is noted at the right renal pelvis. There is diffuse wall thickening along the course of the right ureter, reflecting right-sided ureteritis. Minimal right-sided hydronephrosis is noted. The left kidney is grossly unremarkable in appearance. No additional renal or ureteral stones are identified. Stomach/Bowel: The appendix is not well seen. Vague soft tissue inflammation about the cecum appears to reflect the adjacent right ureteral process. The patient's left lower quadrant colostomy is grossly unremarkable in appearance, aside from herniation of a short segment of small bowel into the colostomy site. There is no evidence for bowel obstruction. Minimal  diverticulosis is noted along the distal descending colon. Remaining visualized small bowel loops are grossly unremarkable in appearance. The stomach is decompressed and grossly unremarkable. Vascular/Lymphatic: Scattered calcification is seen along the abdominal aorta and its branches. The abdominal aorta is otherwise grossly unremarkable. The inferior vena cava is grossly unremarkable. No retroperitoneal lymphadenopathy is seen. No pelvic sidewall lymphadenopathy is identified. Reproductive: The patient is status post hysterectomy. There is diffuse soft tissue inflammation about the decompressed bladder, raising concern for cystitis. Vague nodular soft tissue density is seen within the presacral region, with associated trace free fluid, raising question for metastatic disease. Other: No additional soft tissue abnormalities are seen. Musculoskeletal: No acute osseous abnormalities are identified. Facet disease is noted at the lower lumbar spine. The visualized musculature is unremarkable in appearance. IMPRESSION: 1. Diffusely heterogeneous enhancement of the right kidney, with right renal enlargement and diffuse wall thickening along the right renal pelvis, compatible with right-sided pyelonephritis. Large 1.5 cm stone at the right renal pelvis, with minimal right-sided hydronephrosis. Diffuse wall thickening along the right ureter, reflecting right-sided ureteritis. 2. Diffuse soft tissue inflammation about the decompressed bladder, raising concern for cystitis. 3. Vague nodular soft tissue density at the presacral region, with associated trace free fluid, raising question for metastatic disease. Would correlate with the patient's symptoms, and consider PET/CT for further evaluation, as deemed clinically appropriate. 4. Vague soft tissue inflammation tracks about the inferior aspect of the liver, with fluid tracking along the right paracolic gutter, likely reflecting the right renal process. 5. Tiny hiatal hernia  noted. 6. Left lower quadrant colostomy is grossly unremarkable in appearance, aside from herniation of a short segment of small bowel into the colostomy site. No evidence of bowel obstruction. 7. Minimal diverticulosis at the distal descending colon, without evidence of diverticulitis. 8. Scattered aortic atherosclerosis. Electronically Signed   By: Garald Balding M.D.   On: 01/10/2017 19:06  ASSESSMENT AND PLAN:   83 year old female with history of colorectal cancer status post colostomy who presented with altered mental status and found to have sepsis due to acute pyelonephritis   1. Sepsis with leukocytosis, tachycardia, elevated lactic acid Sepsis is due to pyelonephritis and bacteremia CID positive for Enterobacter Sepsis is improving Continue Levaquin Follow up on the final urine and blood cultures  2. Acute pyelonephritis with bacteremia: Continue Levaquin and follow-up on final cultures  3. Right ureteral stone status post nephrostomy Appreciate urology consult  4. Hyperlipidemia: Continue statin  5. History CVA: Continue aspirin and statin  Expected history of colorectal cancer status post colostomy  Management plans discussed with the patient and daughter and they are in agreement.  CODE STATUS: full  TOTAL TIME TAKING CARE OF THIS PATIENT: 30 minutes.  Physical therapy consult for disposition   POSSIBLE D/C tomorrow, DEPENDING ON CLINICAL CONDITION.   Denym Rahimi M.D on 01/11/2017 at 9:51 AM  Between 7am to 6pm - Pager - [phone] After 6pm go to [email or web address] - password [email or web address] [facility]  Office  [phone]  CC: Primary care physician; Verita Lamb, NP  Note: This dictation was prepared with Dragon dictation along with smaller phrase technology. Any transcriptional errors that result from this process are unintentional.

## 2017-01-11 NOTE — Progress Notes (Signed)
Urology Consult Follow Up  Subjective: Status post right nephrostomy tube placement yesterday, per [name] notes, return of frankly purulent urine from right kidney. MAXIMUM TEMPERATURE 101.8, otherwise hemodynamically stable. Lactate remains mildly elevated. Mental status improving somewhat this morning.  Anti-infectives: Anti-infectives    Start     Dose/Rate Route Frequency Ordered Stop   01/12/17 1800  levofloxacin (LEVAQUIN) IVPB 750 mg     750 mg 100 mL/hr over 90 Minutes Intravenous Every 48 hours 01/10/17 2351     01/10/17 1715  levofloxacin (LEVAQUIN) IVPB 750 mg     750 mg 100 mL/hr over 90 Minutes Intravenous  Once 01/10/17 1704 01/10/17 1844   01/10/17 1715  aztreonam (AZACTAM) 2 g in dextrose 5 % 50 mL IVPB     2 g 100 mL/hr over 30 Minutes Intravenous  Once 01/10/17 1704 01/10/17 1851   01/10/17 1715  vancomycin (VANCOCIN) IVPB 1000 mg/200 mL premix     1,000 mg 200 mL/hr over 60 Minutes Intravenous  Once 01/10/17 1704 01/10/17 1814      Current Facility-Administered Medications  Medication Dose Route Frequency Provider Last Rate Last Dose  . 0.9 %  sodium chloride infusion   Intravenous Continuous Mody, Sital, MD      . acetaminophen (TYLENOL) tablet 650 mg  650 mg Oral Q6H PRN Henreitta Leber, MD       Or  . acetaminophen (TYLENOL) suppository 650 mg  650 mg Rectal Q6H PRN Henreitta Leber, MD      . aspirin EC tablet 81 mg  81 mg Oral Daily Henreitta Leber, MD   81 mg at 01/11/17 0904  . atorvastatin (LIPITOR) tablet 80 mg  80 mg Oral Daily Sainani, Belia Heman, MD      . docusate sodium (COLACE) capsule 100 mg  100 mg Oral Daily Mody, Sital, MD      . enoxaparin (LOVENOX) injection 40 mg  40 mg Subcutaneous Q24H Sainani, Vivek J, MD      . ferrous sulfate tablet 325 mg  325 mg Oral Q breakfast Henreitta Leber, MD   325 mg at 01/11/17 0904  . [START ON 01/12/2017] levofloxacin (LEVAQUIN) IVPB 750 mg  750 mg Intravenous Q48H Sainani, Vivek J, MD      .  loratadine (CLARITIN) tablet 10 mg  10 mg Oral Daily Henreitta Leber, MD   10 mg at 01/11/17 0904  . morphine 2 MG/ML injection 2 mg  2 mg Intravenous Q4H PRN Henreitta Leber, MD      . multivitamin with minerals tablet 1 tablet  1 tablet Oral Daily Henreitta Leber, MD   1 tablet at 01/11/17 0904  . ondansetron (ZOFRAN) tablet 4 mg  4 mg Oral Q6H PRN Henreitta Leber, MD       Or  . ondansetron (ZOFRAN) injection 4 mg  4 mg Intravenous Q6H PRN Henreitta Leber, MD      . vitamin C (ASCORBIC ACID) tablet 500 mg  500 mg Oral Daily Henreitta Leber, MD   500 mg at 01/11/17 4854     Objective: Vital signs in last 24 hours: Temp:  [97.8 F (36.6 C)-101.8 F (38.8 C)] 98.1 F (36.7 C) (05/07 0838) Pulse Rate:  [80-95] 91 (05/07 0838) Resp:  [15-22] 18 (05/07 0838) BP: (113-164)/(42-93) 146/65 (05/07 0838) SpO2:  [2 %-100 %] 98 % (05/07 0838) FiO2 (%):  [96 %] 96 % (05/06 2236) Weight:  [150 lb (68 kg)-196 lb (88.9  kg)] 196 lb (88.9 kg) (05/06 1705)  Intake/Output from previous day: 05/06 0701 - 05/07 0700 In: 1238 [I.V.:733; IV Piggyback:500] Out: 425 [Urine:425] Intake/Output this shift: Total I/O In: 5 [Other:5] Out: -    Physical Exam  Constitutional: She is well-developed, well-nourished, and in no distress.  HENT:  Head: Normocephalic and atraumatic.  Pulmonary/Chest: Effort normal.  Genitourinary:  Genitourinary Comments: Right nephrostomy draining yellow urine with mild debris. Foley draining yellow urine with mild debris.  Neurological: She is alert.  Sleepy but arousable.    Lab Results:   Recent Labs  01/10/17 1648 01/11/17 0030  WBC 16.8* 13.6*  HGB 13.7 11.9*  HCT 41.2 35.4  PLT 140* 100*   BMET  Recent Labs  01/10/17 1648 01/11/17 0030  NA 136 135  K 3.9 3.9  CL 101 104  CO2 27 23  GLUCOSE 170* 171*  BUN 28* 28*  CREATININE 1.17* 1.07*  CALCIUM 8.8* 8.0*   PT/INR  Recent Labs  01/10/17 1648  LABPROT 15.0  INR 1.17   Blood and  urine cultures pending.  Studies/Results: Dg Chest 1 View  Result Date: 01/10/2017 CLINICAL DATA:  Altered mental status.  Sepsis.  Colorectal cancer. EXAM: CHEST 1 VIEW COMPARISON:  None. FINDINGS: Right hemidiaphragm elevation. Mildly degraded exam due to AP portable technique and patient body habitus. The Chin overlies the apices. Patient rotated to the right. Cardiomegaly accentuated by AP portable technique. Atherosclerosis in the transverse aorta. No pleural effusion or pneumothorax. No congestive failure. Mild left base scarring laterally. IMPRESSION: No acute findings. Cardiomegaly without congestive failure. Low lung volumes. Aortic atherosclerosis. Electronically Signed   By: Abigail Miyamoto M.D.   On: 01/10/2017 17:17   Ct Abdomen Pelvis W Contrast  Result Date: 01/10/2017 CLINICAL DATA:  Acute onset of altered mental status. Abdominal sepsis. Initial encounter. EXAM: CT ABDOMEN AND PELVIS WITH CONTRAST TECHNIQUE: Multidetector CT imaging of the abdomen and pelvis was performed using the standard protocol following bolus administration of intravenous contrast. CONTRAST:  17mL ISOVUE-300 IOPAMIDOL (ISOVUE-300) INJECTION 61% COMPARISON:  None. FINDINGS: Lower chest: Minimal bibasilar atelectasis is noted. A tiny hiatal hernia is seen. The visualized portions of the mediastinum are grossly unremarkable. Hepatobiliary: The liver is unremarkable in appearance. The patient is status post cholecystectomy, with clips noted at the gallbladder fossa. The common bile duct remains normal in caliber. Vague soft tissue inflammation tracks about the inferior aspect of the liver, reflecting the adjacent right renal process. Pancreas: The pancreas is within normal limits. Spleen: The spleen is unremarkable in appearance. Adrenals/Urinary Tract: The adrenal glands are grossly unremarkable in appearance. There is diffusely heterogeneous enhancement of the right kidney, with right renal enlargement and diffuse wall  thickening along the right renal pelvis, compatible with right-sided pyelonephritis. Surrounding stranding is noted, tracking inferiorly along the right paracolic gutter. A large 1.5 cm stone is noted at the right renal pelvis. There is diffuse wall thickening along the course of the right ureter, reflecting right-sided ureteritis. Minimal right-sided hydronephrosis is noted. The left kidney is grossly unremarkable in appearance. No additional renal or ureteral stones are identified. Stomach/Bowel: The appendix is not well seen. Vague soft tissue inflammation about the cecum appears to reflect the adjacent right ureteral process. The patient's left lower quadrant colostomy is grossly unremarkable in appearance, aside from herniation of a short segment of small bowel into the colostomy site. There is no evidence for bowel obstruction. Minimal diverticulosis is noted along the distal descending colon. Remaining visualized small bowel  loops are grossly unremarkable in appearance. The stomach is decompressed and grossly unremarkable. Vascular/Lymphatic: Scattered calcification is seen along the abdominal aorta and its branches. The abdominal aorta is otherwise grossly unremarkable. The inferior vena cava is grossly unremarkable. No retroperitoneal lymphadenopathy is seen. No pelvic sidewall lymphadenopathy is identified. Reproductive: The patient is status post hysterectomy. There is diffuse soft tissue inflammation about the decompressed bladder, raising concern for cystitis. Vague nodular soft tissue density is seen within the presacral region, with associated trace free fluid, raising question for metastatic disease. Other: No additional soft tissue abnormalities are seen. Musculoskeletal: No acute osseous abnormalities are identified. Facet disease is noted at the lower lumbar spine. The visualized musculature is unremarkable in appearance. IMPRESSION: 1. Diffusely heterogeneous enhancement of the right kidney, with  right renal enlargement and diffuse wall thickening along the right renal pelvis, compatible with right-sided pyelonephritis. Large 1.5 cm stone at the right renal pelvis, with minimal right-sided hydronephrosis. Diffuse wall thickening along the right ureter, reflecting right-sided ureteritis. 2. Diffuse soft tissue inflammation about the decompressed bladder, raising concern for cystitis. 3. Vague nodular soft tissue density at the presacral region, with associated trace free fluid, raising question for metastatic disease. Would correlate with the patient's symptoms, and consider PET/CT for further evaluation, as deemed clinically appropriate. 4. Vague soft tissue inflammation tracks about the inferior aspect of the liver, with fluid tracking along the right paracolic gutter, likely reflecting the right renal process. 5. Tiny hiatal hernia noted. 6. Left lower quadrant colostomy is grossly unremarkable in appearance, aside from herniation of a short segment of small bowel into the colostomy site. No evidence of bowel obstruction. 7. Minimal diverticulosis at the distal descending colon, without evidence of diverticulitis. 8. Scattered aortic atherosclerosis. Electronically Signed   By: Garald Balding M.D.   On: 01/10/2017 19:06     Assessment: 83 year old female with severe right hydronephrosis secondary to 1.5 cm right UPJ stone, sepsis of urinary source post procedure day 1 status post right percutaneous nephrostomy tube placement.  Plan: - Continue broad-spectrum antibiotics, adjust based on culture and sensitivity data - Routine nephrostomy tube care -Discuss removing Foley today with the patient's daughter, reports that she is frankly incontinent at night and would prefer to keep the catheter 1 more day which is reasonable -Supportive care -Definitive management of the stone to be further determined as outpatient once infection has resolved     LOS: 1 day    Hollice Espy 01/11/2017

## 2017-01-11 NOTE — Evaluation (Signed)
Physical Therapy Evaluation Patient Details Name: Megan Hamilton MRN: [id number] DOB: 09-16-1932 Today's Date: 01/11/2017   History of Present Illness  Pt brought to [facility] by family due to AMS, nausea, and vomiting. She was daignosed with sepsis secondary to acute pyelonephritis and is now s/p R percutaneous nephrostomy secondary to acute R nephrolithiasis. Pt is still confused at time of PT evaluation and history is provided by daughter.  Clinical Impression  Pt admitted with above diagnosis. Pt currently with functional limitations due to the deficits listed below (see PT Problem List).  Pt is very lethargic and confused at time of PT evaluation. She is only able to follow approximately 10% of commands. She requires modA+1 for bed mobility and modA+2 for sit to stand transfers. She is very unsteady in sitting and standing and is unsafe to attempt ambulation at this time. In present condition pt will need SNF placement at discharge. Family can provide 24/7 assist and would like to take patient home if at all possible but understand this is not reasonable in current state. Will continue to follow and upgrade discharge recommendations as necessary if patient demonstrates significant progress. Pt will benefit from skilled PT services to address deficits in strength, balance, and mobility in order to return to full function at home.     Follow Up Recommendations SNF;Other (comment) (Family hopes to be able to take patient home)    Equipment Recommendations  None recommended by PT    Recommendations for Other Services       Precautions / Restrictions Precautions Precautions: Fall Restrictions Weight Bearing Restrictions: No      Mobility  Bed Mobility Overal bed mobility: Needs Assistance Bed Mobility: Supine to Sit;Sit to Supine     Supine to sit: Mod assist Sit to supine: Mod assist   General bed mobility comments: Pt with poor command follow. Lethargic and requires heavy tactile  cues and hand over hand to perform bed mobility. Once upright requires minA+1 to remain upright as she continually leans to the R and falls over  Transfers Overall transfer level: Needs assistance Equipment used: Rolling walker (2 wheeled) Transfers: Sit to/from Stand Sit to Stand: Mod assist;+2 physical assistance         General transfer comment: Pt is very weak and does not follow commands. Requires modA+2 to come to standing and minA+2 to remain upright. She leans heavily to the right and eventually sits back down despite repeated cues to remain upright  Ambulation/Gait             General Gait Details: Unable/unsafe to attempt at this time  Stairs            Wheelchair Mobility    Modified Rankin (Stroke Patients Only)       Balance Overall balance assessment: Needs assistance Sitting-balance support: Bilateral upper extremity supported;Feet supported Sitting balance-Leahy Scale: Poor Sitting balance - Comments: Pt falls to the right continually in sitting   Standing balance support: Bilateral upper extremity supported Standing balance-Leahy Scale: Poor Standing balance comment: Pt requires constant minA+2 to remain upright due to heavy leaning to the R and falling back onto bed                             Pertinent Vitals/Pain Pain Assessment: No/denies pain    Home Living Family/patient expects to be discharged to:: Private residence Living Arrangements: Children Available Help at Discharge: Family;Available 24 hours/day Type of Home: House  Home Access: Ramped entrance     Home Layout: One level Home Equipment: Walker - 2 wheels;Grab bars - tub/shower;Shower seat (Lift chair)      Prior Function Level of Independence: Needs assistance   Gait / Transfers Assistance Needed: Pt ambulates with use of rolling walker limited househodl distances. Mostly stays at home  ADL's / Homemaking Assistance Needed: Assist from daughter with  ADLs/IADIs.         Hand Dominance   Dominant Hand: Right    Extremity/Trunk Assessment   Upper Extremity Assessment Upper Extremity Assessment: Difficult to assess due to impaired cognition    Lower Extremity Assessment Lower Extremity Assessment: Difficult to assess due to impaired cognition       Communication   Communication: No difficulties  Cognition Arousal/Alertness: Lethargic Behavior During Therapy: Flat affect Overall Cognitive Status: Impaired/Different from baseline Area of Impairment: Orientation                 Orientation Level: Disoriented to;Place;Time;Situation (Only able to provide first name, unable to provide DOB)                    General Comments      Exercises     Assessment/Plan    PT Assessment Patient needs continued PT services  PT Problem List Decreased strength;Decreased balance;Decreased activity tolerance;Decreased mobility;Decreased cognition       PT Treatment Interventions DME instruction;Gait training;Functional mobility training;Therapeutic activities;Therapeutic exercise;Balance training;Neuromuscular re-education;Cognitive remediation;Patient/family education    PT Goals (Current goals can be found in the Care Plan section)  Acute Rehab PT Goals Patient Stated Goal: Return home with family (goal from daughter) PT Goal Formulation: With family Time For Goal Achievement: 01/25/17 Potential to Achieve Goals: Fair    Frequency Min 2X/week   Barriers to discharge        Co-evaluation               AM-PAC PT "6 Clicks" Daily Activity  Outcome Measure Difficulty turning over in bed (including adjusting bedclothes, sheets and blankets)?: Total Difficulty moving from lying on back to sitting on the side of the bed? : Total Difficulty sitting down on and standing up from a chair with arms (e.g., wheelchair, bedside commode, etc,.)?: Total Help needed moving to and from a bed to chair (including a  wheelchair)?: A Lot Help needed walking in hospital room?: A Lot Help needed climbing 3-5 steps with a railing? : Total 6 Click Score: 8    End of Session Equipment Utilized During Treatment: Gait belt Activity Tolerance: No increased pain;Other (comment) (Limited due to confusion) Patient left: in bed   PT Visit Diagnosis: Unsteadiness on feet (R26.81);Muscle weakness (generalized) (M62.81)    Time: 6681-5947 PT Time Calculation (min) (ACUTE ONLY): 17 min   Charges:   PT Evaluation $PT Eval Low Complexity: 1 Procedure     PT G Codes:        Lyndel Safe [place] PT, DPT    Christalynn Boise 01/11/2017, 10:33 AM

## 2017-01-11 NOTE — Progress Notes (Signed)
PHARMACY - PHYSICIAN COMMUNICATION CRITICAL VALUE ALERT - BLOOD CULTURE IDENTIFICATION (BCID)  Results for orders placed or performed during the hospital encounter of 01/10/17  Blood Culture ID Panel (Reflexed) (Collected: 01/10/2017  4:48 PM)  Result Value Ref Range   Enterococcus species NOT DETECTED NOT DETECTED   Listeria monocytogenes NOT DETECTED NOT DETECTED   Staphylococcus species NOT DETECTED NOT DETECTED   Staphylococcus aureus NOT DETECTED NOT DETECTED   Streptococcus species NOT DETECTED NOT DETECTED   Streptococcus agalactiae NOT DETECTED NOT DETECTED   Streptococcus pneumoniae NOT DETECTED NOT DETECTED   Streptococcus pyogenes NOT DETECTED NOT DETECTED   Acinetobacter baumannii NOT DETECTED NOT DETECTED   Enterobacteriaceae species DETECTED (A) NOT DETECTED   Enterobacter cloacae complex NOT DETECTED NOT DETECTED   Escherichia coli NOT DETECTED NOT DETECTED   Klebsiella oxytoca NOT DETECTED NOT DETECTED   Klebsiella pneumoniae NOT DETECTED NOT DETECTED   Proteus species NOT DETECTED NOT DETECTED   Serratia marcescens NOT DETECTED NOT DETECTED   Carbapenem resistance NOT DETECTED NOT DETECTED   Haemophilus influenzae NOT DETECTED NOT DETECTED   Neisseria meningitidis NOT DETECTED NOT DETECTED   Pseudomonas aeruginosa NOT DETECTED NOT DETECTED   Candida albicans NOT DETECTED NOT DETECTED   Candida glabrata NOT DETECTED NOT DETECTED   Candida krusei NOT DETECTED NOT DETECTED   Candida parapsilosis NOT DETECTED NOT DETECTED   Candida tropicalis NOT DETECTED NOT DETECTED    Name of physician (or Provider) Contacted: Mody  Changes to prescribed antibiotics required: Will continue with current orders for Levaquin and await further culture results.  Paulina Fusi, PharmD, BCPS 01/11/2017 9:57 AM

## 2017-01-12 ENCOUNTER — Encounter: Payer: Self-pay | Admitting: Interventional Radiology

## 2017-01-12 DIAGNOSIS — R748 Abnormal levels of other serum enzymes: Secondary | ICD-10-CM

## 2017-01-12 DIAGNOSIS — I2699 Other pulmonary embolism without acute cor pulmonale: Secondary | ICD-10-CM

## 2017-01-12 DIAGNOSIS — N132 Hydronephrosis with renal and ureteral calculous obstruction: Secondary | ICD-10-CM

## 2017-01-12 DIAGNOSIS — N200 Calculus of kidney: Secondary | ICD-10-CM

## 2017-01-12 DIAGNOSIS — R000 Tachycardia, unspecified: Secondary | ICD-10-CM

## 2017-01-12 DIAGNOSIS — N12 Tubulo-interstitial nephritis, not specified as acute or chronic: Secondary | ICD-10-CM

## 2017-01-12 DIAGNOSIS — A419 Sepsis, unspecified organism: Secondary | ICD-10-CM

## 2017-01-12 LAB — LACTIC ACID, PLASMA
Lactic Acid, Venous: 2 mmol/L (ref 0.5–1.9)
Lactic Acid, Venous: 1.5 mmol/L (ref 0.5–1.9)

## 2017-01-12 NOTE — Clinical Social Work Note (Signed)
Clinical Social Work Assessment  Patient Details  Name: Megan Hamilton MRN: [id number] Date of Birth: [birth year]  Date of referral:  01/12/17               Reason for consult:  Facility Placement, Discharge Planning                Permission sought to share information with:  Family Supports Permission granted to share information::  Yes, Verbal Permission Granted  Name::     Hermine Messick  Relationship::  daughter  Contact Information:  [phone]  Housing/Transportation Living arrangements for the past 2 months:  [place] of Information:  Adult Children (Pt was sleeping soundly) Patient Interpreter Needed:  None Criminal Activity/Legal Involvement Pertinent to Current Situation/Hospitalization:  No - Comment as needed Significant Relationships:  Adult Children Lives with:  Adult Children Do you feel safe going back to the place where you live?  Yes Need for family participation in patient care:  Yes (Comment)  Care giving concerns:  No care giving concerns identified.   Social Worker assessment / plan:  CSW met with pt and daughter to address consult. Pt was sleeping soundly during assessment. CSW introduced herself and explained role of social work. CSW also explained the process of discharging to SNF. PT is recommending SNF. Pt's daughter would like for her to return home, however she is unable to care for her at home. CSW initiated SNF search and will follow up with bed offers. CSW will continue to follow.   Employment status:  Retired Nurse, adult PT Recommendations:  [place] / Referral to community resources:  [place]  Patient/Family's Response to care:  Pt's daughter was Patent attorney of CSW support.   Patient/Family's Understanding of and Emotional Response to Diagnosis, Current Treatment, and Prognosis:  Pt's daughter understands that would benefit from STR prior to returning  home.   Emotional Assessment Appearance:  Appears stated age Attitude/Demeanor/Rapport:  Other (Pt was sleeping soundly) Affect (typically observed):  Other (Pt was sleeping soundly) Orientation:  Oriented to Self Alcohol / Substance use:  Not Applicable Psych involvement (Current and /or in the community):  No (Comment)  Discharge Needs  Concerns to be addressed:  Adjustment to Illness Readmission within the last 30 days:  No Current discharge risk:  Chronically ill Barriers to Discharge:  Continued Medical Work up   Darden Dates, [place] 01/12/2017, 4:47 PM

## 2017-01-12 NOTE — Progress Notes (Signed)
Megan Hamilton at [place] NAME: Megan Hamilton    MR#:  [id number]  DATE OF BIRTH:  22-May-1933  SUBJECTIVE:   Patient Had a fever of 103 at 5:00 last night  Confusion has improved REVIEW OF SYSTEMS:    Review of Systems  Constitutional: Positive fever at 1700, no chills weight loss HENT: Negative for ear pain, nosebleeds, congestion, facial swelling, rhinorrhea, neck pain, neck stiffness and ear discharge.   Respiratory: Negative for cough, shortness of breath, wheezing  Cardiovascular: Negative for chest pain, palpitations and leg swelling.  Gastrointestinal: Negative for heartburn, abdominal pain, vomiting, diarrhea or consitpation Genitourinary: Negative for dysuria, urgency, frequency, hematuria Musculoskeletal: Negative for back pain or joint pain Neurological: Negative for dizziness, seizures, syncope, focal weakness,  numbness and headaches.  Hematological: Does not bruise/bleed easily.  Psychiatric/Behavioral: Negative for hallucinations, dysphoric mood   Tolerating Diet:  yes     DRUG ALLERGIES:   Allergies  Allergen Reactions  . Cephalexin Hives  . Quinine Derivatives     VITALS:  Blood pressure 112/61, pulse (!) 138, temperature 98.9 F (37.2 C), temperature source Oral, resp. rate 18, height 5\' 6"  (1.676 m), weight 88.9 kg (196 lb), SpO2 92 %.  PHYSICAL EXAMINATION:  Constitutional: Appears well-developed and well-nourished. No distress. HENT: Normocephalic. Marland Kitchen Oropharynx is clear and moist.  Eyes: Conjunctivae and EOM are normal. PERRLA, no scleral icterus.  Neck: Normal ROM. Neck supple. No JVD. No tracheal deviation. CVS: RRR, S1/S2 +, no murmurs, no gallops, no carotid bruit.  Pulmonary: Effort and breath sounds normal, no stridor, rhonchi, wheezes, rales.  Abdominal: Soft. BS +,  no distension, tenderness, rebound or guarding.  Has nephrostomy tube right side  [place] colostomy left side  Musculoskeletal: Normal  range of motion. No edema and no tenderness.  Neuro: Alert. CN 2-12 grossly intact. No focal deficits. Skin: Skin is warm and dry. No rash noted. Psychiatric: Normal mood and affect.      LABORATORY PANEL:   CBC  Recent Labs Lab 01/11/17 0030  WBC 13.6*  HGB 11.9*  HCT 35.4  PLT 100*   ------------------------------------------------------------------------------------------------------------------  Chemistries   Recent Labs Lab 01/10/17 1648 01/11/17 0030  NA 136 135  K 3.9 3.9  CL 101 104  CO2 27 23  GLUCOSE 170* 171*  BUN 28* 28*  CREATININE 1.17* 1.07*  CALCIUM 8.8* 8.0*  AST 45*  --   ALT 35  --   ALKPHOS 48  --   BILITOT 1.7*  --    ------------------------------------------------------------------------------------------------------------------  Cardiac Enzymes  Recent Labs Lab 01/10/17 1701  TROPONINI 0.03*   ------------------------------------------------------------------------------------------------------------------  RADIOLOGY:  Dg Chest 1 View  Result Date: 01/10/2017 CLINICAL DATA:  Altered mental status.  Sepsis.  Colorectal cancer. EXAM: CHEST 1 VIEW COMPARISON:  None. FINDINGS: Right hemidiaphragm elevation. Mildly degraded exam due to AP portable technique and patient body habitus. The Chin overlies the apices. Patient rotated to the right. Cardiomegaly accentuated by AP portable technique. Atherosclerosis in the transverse aorta. No pleural effusion or pneumothorax. No congestive failure. Mild left base scarring laterally. IMPRESSION: No acute findings. Cardiomegaly without congestive failure. Low lung volumes. Aortic atherosclerosis. Electronically Signed   By: Abigail Miyamoto M.D.   On: 01/10/2017 17:17   Ct Abdomen Pelvis W Contrast  Result Date: 01/10/2017 CLINICAL DATA:  Acute onset of altered mental status. Abdominal sepsis. Initial encounter. EXAM: CT ABDOMEN AND PELVIS WITH CONTRAST TECHNIQUE: Multidetector CT imaging of the abdomen  and pelvis was  performed using the standard protocol following bolus administration of intravenous contrast. CONTRAST:  80mL ISOVUE-300 IOPAMIDOL (ISOVUE-300) INJECTION 61% COMPARISON:  None. FINDINGS: Lower chest: Minimal bibasilar atelectasis is noted. A tiny hiatal hernia is seen. The visualized portions of the mediastinum are grossly unremarkable. Hepatobiliary: The liver is unremarkable in appearance. The patient is status post cholecystectomy, with clips noted at the gallbladder fossa. The common bile duct remains normal in caliber. Vague soft tissue inflammation tracks about the inferior aspect of the liver, reflecting the adjacent right renal process. Pancreas: The pancreas is within normal limits. Spleen: The spleen is unremarkable in appearance. Adrenals/Urinary Tract: The adrenal glands are grossly unremarkable in appearance. There is diffusely heterogeneous enhancement of the right kidney, with right renal enlargement and diffuse wall thickening along the right renal pelvis, compatible with right-sided pyelonephritis. Surrounding stranding is noted, tracking inferiorly along the right paracolic gutter. A large 1.5 cm stone is noted at the right renal pelvis. There is diffuse wall thickening along the course of the right ureter, reflecting right-sided ureteritis. Minimal right-sided hydronephrosis is noted. The left kidney is grossly unremarkable in appearance. No additional renal or ureteral stones are identified. Stomach/Bowel: The appendix is not well seen. Vague soft tissue inflammation about the cecum appears to reflect the adjacent right ureteral process. The patient's left lower quadrant colostomy is grossly unremarkable in appearance, aside from herniation of a short segment of small bowel into the colostomy site. There is no evidence for bowel obstruction. Minimal diverticulosis is noted along the distal descending colon. Remaining visualized small bowel loops are grossly unremarkable in  appearance. The stomach is decompressed and grossly unremarkable. Vascular/Lymphatic: Scattered calcification is seen along the abdominal aorta and its branches. The abdominal aorta is otherwise grossly unremarkable. The inferior vena cava is grossly unremarkable. No retroperitoneal lymphadenopathy is seen. No pelvic sidewall lymphadenopathy is identified. Reproductive: The patient is status post hysterectomy. There is diffuse soft tissue inflammation about the decompressed bladder, raising concern for cystitis. Vague nodular soft tissue density is seen within the presacral region, with associated trace free fluid, raising question for metastatic disease. Other: No additional soft tissue abnormalities are seen. Musculoskeletal: No acute osseous abnormalities are identified. Facet disease is noted at the lower lumbar spine. The visualized musculature is unremarkable in appearance. IMPRESSION: 1. Diffusely heterogeneous enhancement of the right kidney, with right renal enlargement and diffuse wall thickening along the right renal pelvis, compatible with right-sided pyelonephritis. Large 1.5 cm stone at the right renal pelvis, with minimal right-sided hydronephrosis. Diffuse wall thickening along the right ureter, reflecting right-sided ureteritis. 2. Diffuse soft tissue inflammation about the decompressed bladder, raising concern for cystitis. 3. Vague nodular soft tissue density at the presacral region, with associated trace free fluid, raising question for metastatic disease. Would correlate with the patient's symptoms, and consider PET/CT for further evaluation, as deemed clinically appropriate. 4. Vague soft tissue inflammation tracks about the inferior aspect of the liver, with fluid tracking along the right paracolic gutter, likely reflecting the right renal process. 5. Tiny hiatal hernia noted. 6. Left lower quadrant colostomy is grossly unremarkable in appearance, aside from herniation of a short segment of  small bowel into the colostomy site. No evidence of bowel obstruction. 7. Minimal diverticulosis at the distal descending colon, without evidence of diverticulitis. 8. Scattered aortic atherosclerosis. Electronically Signed   By: Garald Balding M.D.   On: 01/10/2017 19:06     ASSESSMENT AND PLAN:   83 year old female with history of colorectal cancer status post colostomy  who presented with altered mental status and found to have sepsis due to acute pyelonephritis   1. Sepsis with leukocytosis, tachycardia, elevated lactic acid Sepsis is due to pyelonephritis and bacteremia BCID positive for Enterobacter Continue Levaquin Follow up on the final urine and blood cultures  2. Acute pyelonephritis with bacteremia: Continue Levaquin and follow-up on final cultures Probable DC Foley today  3. Right ureteral stone status post nephrostomy Appreciate urology consult  4. Hyperlipidemia: Continue statin  5. History CVA: Continue aspirin and statin 6 history of colorectal cancer status post colostomy  Management plans discussed with the patient and she is in agreement.  CODE STATUS: full  TOTAL TIME TAKING CARE OF THIS PATIENT: 27 minutes.  Physical therapy recommending skilled nursing facility  POSSIBLE D/C 1-2 days, DEPENDING ON CLINICAL CONDITION/blood cx results.   Justan Gaede M.D on 01/12/2017 at 7:01 AM  Between 7am to 6pm - Pager - [phone] After 6pm go to [email or web address] - password [email or web address] [facility]  Office  [phone]  CC: Primary care physician; Verita Lamb, NP  Note: This dictation was prepared with Dragon dictation along with smaller phrase technology. Any transcriptional errors that result from this process are unintentional.

## 2017-01-12 NOTE — Progress Notes (Signed)
Urology Consult Follow Up  Subjective: Status post right nephrostomy tube placement 01/10/2017, per [name] notes, return of frankly purulent urine from right kidney. Urine culture is still pending.  Blood culture positive for enterobacteriaceae.  MAXIMUM TEMPERATURE 103 at 5pm last evening with tachycardia this am.  Seen by Dr. Benjie Karvonen this am.  Lactate remains mildly elevated. Mental status is stable per staff.  Creatinine 1.07 and WBC 13.6 yesterday.  UOP good.  Anti-infectives: Anti-infectives    Start     Dose/Rate Route Frequency Ordered Stop   01/12/17 1800  levofloxacin (LEVAQUIN) IVPB 750 mg     750 mg 100 mL/hr over 90 Minutes Intravenous Every 48 hours 01/10/17 2351     01/10/17 1715  levofloxacin (LEVAQUIN) IVPB 750 mg     750 mg 100 mL/hr over 90 Minutes Intravenous  Once 01/10/17 1704 01/10/17 1844   01/10/17 1715  aztreonam (AZACTAM) 2 g in dextrose 5 % 50 mL IVPB     2 g 100 mL/hr over 30 Minutes Intravenous  Once 01/10/17 1704 01/10/17 1851   01/10/17 1715  vancomycin (VANCOCIN) IVPB 1000 mg/200 mL premix     1,000 mg 200 mL/hr over 60 Minutes Intravenous  Once 01/10/17 1704 01/10/17 1814      Current Facility-Administered Medications  Medication Dose Route Frequency Provider Last Rate Last Dose  . acetaminophen (TYLENOL) tablet 650 mg  650 mg Oral Q6H PRN Henreitta Leber, MD   650 mg at 01/11/17 1734   Or  . acetaminophen (TYLENOL) suppository 650 mg  650 mg Rectal Q6H PRN Henreitta Leber, MD      . aspirin EC tablet 81 mg  81 mg Oral Daily Henreitta Leber, MD   81 mg at 01/11/17 0904  . atorvastatin (LIPITOR) tablet 80 mg  80 mg Oral Daily Henreitta Leber, MD   80 mg at 01/11/17 2034  . docusate sodium (COLACE) capsule 100 mg  100 mg Oral Daily Bettey Costa, MD   100 mg at 01/11/17 1224  . enoxaparin (LOVENOX) injection 40 mg  40 mg Subcutaneous Q24H Henreitta Leber, MD   40 mg at 01/11/17 2034  . ferrous sulfate tablet 325 mg  325 mg Oral Q breakfast  Henreitta Leber, MD   325 mg at 01/11/17 0904  . levofloxacin (LEVAQUIN) IVPB 750 mg  750 mg Intravenous Q48H Sainani, Vivek J, MD      . loratadine (CLARITIN) tablet 10 mg  10 mg Oral Daily Henreitta Leber, MD   10 mg at 01/11/17 0904  . morphine 2 MG/ML injection 2 mg  2 mg Intravenous Q4H PRN Henreitta Leber, MD      . multivitamin with minerals tablet 1 tablet  1 tablet Oral Daily Henreitta Leber, MD   1 tablet at 01/11/17 0904  . ondansetron (ZOFRAN) tablet 4 mg  4 mg Oral Q6H PRN Henreitta Leber, MD       Or  . ondansetron (ZOFRAN) injection 4 mg  4 mg Intravenous Q6H PRN Henreitta Leber, MD   4 mg at 01/11/17 1741  . traMADol (ULTRAM) tablet 50 mg  50 mg Oral Q6H PRN Mody, Sital, MD      . vitamin C (ASCORBIC ACID) tablet 500 mg  500 mg Oral Daily Henreitta Leber, MD   500 mg at 01/11/17 0904     Objective: Vital signs in last 24 hours: Temp:  [98.1 F (36.7 C)-103 F (39.4 C)] 98.9 F (  37.2 C) (05/08 0441) Pulse Rate:  [91-154] 138 (05/08 0441) Resp:  [18-20] 18 (05/08 0441) BP: (112-146)/(54-65) 112/61 (05/08 0441) SpO2:  [90 %-98 %] 92 % (05/08 0441)  Intake/Output from previous day: 05/07 0701 - 05/08 0700 In: 1428.8 [P.O.:180; I.V.:1238.8] Out: 1200 [Urine:1200] Intake/Output this shift: No intake/output data recorded.   Physical Exam  Constitutional: She is well-developed, well-nourished, and in no distress.  HENT:  Head: Normocephalic and atraumatic.  Pulmonary/Chest: Effort normal.  Genitourinary:  Genitourinary Comments: Right nephrostomy draining yellow urine with mild debris. Foley draining yellow urine with mild debris.  Dressings clean and dry.    Neurological: She is alert.  Mild confusion, but baseline per staff    Lab Results:   Recent Labs  01/10/17 1648 01/11/17 0030  WBC 16.8* 13.6*  HGB 13.7 11.9*  HCT 41.2 35.4  PLT 140* 100*   BMET  Recent Labs  01/10/17 1648 01/11/17 0030  NA 136 135  K 3.9 3.9  CL 101 104  CO2 27 23   GLUCOSE 170* 171*  BUN 28* 28*  CREATININE 1.17* 1.07*  CALCIUM 8.8* 8.0*   PT/INR  Recent Labs  01/10/17 1648  LABPROT 15.0  INR 1.17   Blood and urine cultures pending.  Studies/Results: Dg Chest 1 View  Result Date: 01/10/2017 CLINICAL DATA:  Altered mental status.  Sepsis.  Colorectal cancer. EXAM: CHEST 1 VIEW COMPARISON:  None. FINDINGS: Right hemidiaphragm elevation. Mildly degraded exam due to AP portable technique and patient body habitus. The Chin overlies the apices. Patient rotated to the right. Cardiomegaly accentuated by AP portable technique. Atherosclerosis in the transverse aorta. No pleural effusion or pneumothorax. No congestive failure. Mild left base scarring laterally. IMPRESSION: No acute findings. Cardiomegaly without congestive failure. Low lung volumes. Aortic atherosclerosis. Electronically Signed   By: Abigail Miyamoto M.D.   On: 01/10/2017 17:17   Ct Abdomen Pelvis W Contrast  Result Date: 01/10/2017 CLINICAL DATA:  Acute onset of altered mental status. Abdominal sepsis. Initial encounter. EXAM: CT ABDOMEN AND PELVIS WITH CONTRAST TECHNIQUE: Multidetector CT imaging of the abdomen and pelvis was performed using the standard protocol following bolus administration of intravenous contrast. CONTRAST:  74mL ISOVUE-300 IOPAMIDOL (ISOVUE-300) INJECTION 61% COMPARISON:  None. FINDINGS: Lower chest: Minimal bibasilar atelectasis is noted. A tiny hiatal hernia is seen. The visualized portions of the mediastinum are grossly unremarkable. Hepatobiliary: The liver is unremarkable in appearance. The patient is status post cholecystectomy, with clips noted at the gallbladder fossa. The common bile duct remains normal in caliber. Vague soft tissue inflammation tracks about the inferior aspect of the liver, reflecting the adjacent right renal process. Pancreas: The pancreas is within normal limits. Spleen: The spleen is unremarkable in appearance. Adrenals/Urinary Tract: The adrenal  glands are grossly unremarkable in appearance. There is diffusely heterogeneous enhancement of the right kidney, with right renal enlargement and diffuse wall thickening along the right renal pelvis, compatible with right-sided pyelonephritis. Surrounding stranding is noted, tracking inferiorly along the right paracolic gutter. A large 1.5 cm stone is noted at the right renal pelvis. There is diffuse wall thickening along the course of the right ureter, reflecting right-sided ureteritis. Minimal right-sided hydronephrosis is noted. The left kidney is grossly unremarkable in appearance. No additional renal or ureteral stones are identified. Stomach/Bowel: The appendix is not well seen. Vague soft tissue inflammation about the cecum appears to reflect the adjacent right ureteral process. The patient's left lower quadrant colostomy is grossly unremarkable in appearance, aside from herniation of a  short segment of small bowel into the colostomy site. There is no evidence for bowel obstruction. Minimal diverticulosis is noted along the distal descending colon. Remaining visualized small bowel loops are grossly unremarkable in appearance. The stomach is decompressed and grossly unremarkable. Vascular/Lymphatic: Scattered calcification is seen along the abdominal aorta and its branches. The abdominal aorta is otherwise grossly unremarkable. The inferior vena cava is grossly unremarkable. No retroperitoneal lymphadenopathy is seen. No pelvic sidewall lymphadenopathy is identified. Reproductive: The patient is status post hysterectomy. There is diffuse soft tissue inflammation about the decompressed bladder, raising concern for cystitis. Vague nodular soft tissue density is seen within the presacral region, with associated trace free fluid, raising question for metastatic disease. Other: No additional soft tissue abnormalities are seen. Musculoskeletal: No acute osseous abnormalities are identified. Facet disease is noted at  the lower lumbar spine. The visualized musculature is unremarkable in appearance. IMPRESSION: 1. Diffusely heterogeneous enhancement of the right kidney, with right renal enlargement and diffuse wall thickening along the right renal pelvis, compatible with right-sided pyelonephritis. Large 1.5 cm stone at the right renal pelvis, with minimal right-sided hydronephrosis. Diffuse wall thickening along the right ureter, reflecting right-sided ureteritis. 2. Diffuse soft tissue inflammation about the decompressed bladder, raising concern for cystitis. 3. Vague nodular soft tissue density at the presacral region, with associated trace free fluid, raising question for metastatic disease. Would correlate with the patient's symptoms, and consider PET/CT for further evaluation, as deemed clinically appropriate. 4. Vague soft tissue inflammation tracks about the inferior aspect of the liver, with fluid tracking along the right paracolic gutter, likely reflecting the right renal process. 5. Tiny hiatal hernia noted. 6. Left lower quadrant colostomy is grossly unremarkable in appearance, aside from herniation of a short segment of small bowel into the colostomy site. No evidence of bowel obstruction. 7. Minimal diverticulosis at the distal descending colon, without evidence of diverticulitis. 8. Scattered aortic atherosclerosis. Electronically Signed   By: Garald Balding M.D.   On: 01/10/2017 19:06     Assessment: 83 year old female with severe right hydronephrosis secondary to 1.5 cm right UPJ stone, sepsis of urinary source post procedure day 2 status post right percutaneous nephrostomy tube placement.  Plan: - Continue broad-spectrum antibiotics, adjust based on culture and sensitivity data - urine culture still pending - Routine nephrostomy tube care - Okay to remove Foley from urological standpoint  - Supportive care - Definitive management of the stone to be further determined as outpatient once infection has  resolved     LOS: 2 days    [facility] 01/12/2017

## 2017-01-12 NOTE — NC FL2 (Signed)
[place] LEVEL OF CARE SCREENING TOOL     IDENTIFICATION  Patient Name: Megan Hamilton Birthdate: 07/11/1933 Sex: female Admission Date (Current Location): 01/10/2017  [place] and [place] Number:  Engineering geologist and Address:  [facility], [address] [place], [place] [address]      Provider Number: [id number]  Attending Physician Name and Address:  Bettey Costa, MD  Relative Name and Phone Number:       Current Level of Care: Hospital Recommended Level of Care: [place] Prior Approval Number:    Date Approved/Denied:   PASRR Number: [id number]  Discharge Plan: SNF    Current Diagnoses: Patient Active Problem List   Diagnosis Date Noted  . Pyelonephritis 01/10/2017  . Kidney stone   . Sepsis ([place])     Orientation RESPIRATION BLADDER Height & Weight     Self  Normal Incontinent Weight: 196 lb (88.9 kg) Height:  5\' 6"  (167.6 cm)  BEHAVIORAL SYMPTOMS/MOOD NEUROLOGICAL BOWEL NUTRITION STATUS      Incontinent Diet (Soft Diet)  AMBULATORY STATUS COMMUNICATION OF NEEDS Skin   Extensive Assist Verbally Normal                       Personal Care Assistance Level of Assistance  Bathing, Feeding, Dressing Bathing Assistance: Limited assistance Feeding assistance: Independent Dressing Assistance: Limited assistance     Functional Limitations Info  Sight, Hearing, Speech Sight Info: Adequate Hearing Info: Adequate Speech Info: Adequate    SPECIAL CARE FACTORS FREQUENCY  PT (By licensed PT)     PT Frequency: 5              Contractures Contractures Info: Not present    Additional Factors Info  Code Status, Allergies Code Status Info: DNR Allergies Info:  Cephalexin, Quinine Derivatives           Current Medications (01/12/2017):  This is the current hospital active medication list Current Facility-Administered Medications  Medication Dose Route Frequency Provider Last Rate Last  Dose  . acetaminophen (TYLENOL) tablet 650 mg  650 mg Oral Q6H PRN Henreitta Leber, MD   650 mg at 01/11/17 1734   Or  . acetaminophen (TYLENOL) suppository 650 mg  650 mg Rectal Q6H PRN Henreitta Leber, MD      . aspirin EC tablet 81 mg  81 mg Oral Daily Henreitta Leber, MD   81 mg at 01/12/17 0749  . atorvastatin (LIPITOR) tablet 80 mg  80 mg Oral Daily Henreitta Leber, MD   80 mg at 01/12/17 0748  . docusate sodium (COLACE) capsule 100 mg  100 mg Oral Daily Bettey Costa, MD   100 mg at 01/12/17 0749  . enoxaparin (LOVENOX) injection 40 mg  40 mg Subcutaneous Q24H Henreitta Leber, MD   40 mg at 01/11/17 2034  . ferrous sulfate tablet 325 mg  325 mg Oral Q breakfast Henreitta Leber, MD   325 mg at 01/12/17 0749  . levofloxacin (LEVAQUIN) IVPB 750 mg  750 mg Intravenous Q48H Sainani, Belia Heman, MD 100 mL/hr at 01/12/17 1620 750 mg at 01/12/17 1620  . loratadine (CLARITIN) tablet 10 mg  10 mg Oral Daily Henreitta Leber, MD   10 mg at 01/12/17 0748  . morphine 2 MG/ML injection 2 mg  2 mg Intravenous Q4H PRN Henreitta Leber, MD      . multivitamin with minerals tablet 1 tablet  1 tablet Oral  Daily Henreitta Leber, MD   1 tablet at 01/12/17 [phone]  . ondansetron (ZOFRAN) tablet 4 mg  4 mg Oral Q6H PRN Henreitta Leber, MD       Or  . ondansetron (ZOFRAN) injection 4 mg  4 mg Intravenous Q6H PRN Henreitta Leber, MD   4 mg at 01/11/17 1741  . traMADol (ULTRAM) tablet 50 mg  50 mg Oral Q6H PRN Mody, Sital, MD      . vitamin C (ASCORBIC ACID) tablet 500 mg  500 mg Oral Daily Henreitta Leber, MD   500 mg at 01/12/17 7793     Discharge Medications: Please see discharge summary for a list of discharge medications.  Relevant Imaging Results:  Relevant Lab Results:   Additional Information SSN:  [id number]  Darden Dates, LCSW

## 2017-01-12 NOTE — Clinical Social Work Placement (Signed)
   CLINICAL SOCIAL WORK PLACEMENT  NOTE  Date:  01/12/2017  Patient Details  Name: Megan Hamilton MRN: [id number] Date of Birth: 01/18/1933  Clinical Social Work is seeking post-discharge placement for this patient at the [place] level of care (*CSW will initial, date and re-position this form in  chart as items are completed):  Yes   Patient/family provided with [facility] Work Department's list of facilities offering this level of care within the geographic area requested by the patient (or if unable, by the patient's family).  Yes   Patient/family informed of their freedom to choose among providers that offer the needed level of care, that participate in Medicare, Medicaid or managed care program needed by the patient, have an available bed and are willing to accept the patient.  Yes   Patient/family informed of [facility]'s ownership interest in [facility] and [facility], as well as of the fact that they are under no obligation to receive care at these facilities.  PASRR submitted to EDS on       PASRR number received on       Existing PASRR number confirmed on 01/12/17     FL2 transmitted to all facilities in geographic area requested by pt/family on 01/12/17     FL2 transmitted to all facilities within larger geographic area on       Patient informed that his/her managed care company has contracts with or will negotiate with certain facilities, including the following:            Patient/family informed of bed offers received.  Patient chooses bed at       Physician recommends and patient chooses bed at      Patient to be transferred to   on  .  Patient to be transferred to facility by       Patient family notified on   of transfer.  Name of family member notified:        PHYSICIAN       Additional Comment:    _______________________________________________ Darden Dates, LCSW 01/12/2017, 4:37 PM

## 2017-01-13 ENCOUNTER — Inpatient Hospital Stay: Payer: Medicare Other

## 2017-01-13 ENCOUNTER — Encounter: Payer: Self-pay | Admitting: Radiology

## 2017-01-13 ENCOUNTER — Inpatient Hospital Stay
Admit: 2017-01-13 | Discharge: 2017-01-13 | Disposition: A | Discharge status: Home / Self Care | Payer: Medicare Other | Attending: Internal Medicine | Admitting: Internal Medicine

## 2017-01-13 LAB — URINE CULTURE: Special Requests: NORMAL

## 2017-01-13 LAB — CULTURE, BLOOD (ROUTINE X 2)

## 2017-01-13 LAB — ECHOCARDIOGRAM COMPLETE
Height: 66 in
Weight: 3136 oz

## 2017-01-13 MED ORDER — IOPAMIDOL (ISOVUE-370) INJECTION 76%
75.0000 mL | Freq: Once | INTRAVENOUS | Status: AC | PRN
  Administered 2017-01-13: 75 mL via INTRAVENOUS

## 2017-01-13 MED ORDER — METOPROLOL TARTRATE 25 MG PO TABS
12.5000 mg | ORAL_TABLET | Freq: Two times a day (BID) | ORAL | Status: DC
  Administered 2017-01-13 (×2): 12.5 mg via ORAL
  Filled 2017-01-13 (×2): qty 1

## 2017-01-13 MED ORDER — METOPROLOL TARTRATE 5 MG/5ML IV SOLN
5.0000 mg | Freq: Once | INTRAVENOUS | Status: AC
  Administered 2017-01-13: 5 mg via INTRAVENOUS
  Filled 2017-01-13: qty 5

## 2017-01-13 MED ORDER — CIPROFLOXACIN HCL 500 MG PO TABS
500.0000 mg | ORAL_TABLET | Freq: Two times a day (BID) | ORAL | Status: DC
  Administered 2017-01-13 – 2017-01-14 (×3): 500 mg via ORAL
  Filled 2017-01-13 (×3): qty 1

## 2017-01-13 MED ORDER — CIPROFLOXACIN HCL 500 MG PO TABS: 500.0000 mg | ORAL_TABLET | Freq: Two times a day (BID) | ORAL | 0 refills | Status: AC

## 2017-01-13 MED ORDER — ATORVASTATIN CALCIUM 20 MG PO TABS: 80.0000 mg | ORAL_TABLET | Freq: Every day | ORAL | Status: DC

## 2017-01-13 MED ORDER — METOPROLOL TARTRATE 25 MG PO TABS: 12.5000 mg | ORAL_TABLET | Freq: Two times a day (BID) | ORAL | 0 refills | Status: DC

## 2017-01-13 NOTE — Discharge Summary (Addendum)
[place] [facility] [place] NAME: Megan Hamilton    MR#:  [id number]  DATE OF BIRTH:  Apr 30, 1933  DATE OF ADMISSION:  01/10/2017 ADMITTING PHYSICIAN: Henreitta Leber, MD  DATE OF DISCHARGE: 01/14/2017  PRIMARY CARE PHYSICIAN: Verita Lamb, NP    ADMISSION DIAGNOSIS:  Kidney stone [N20.0] Pyelonephritis [N12] Elevated troponin [R74.8] Sepsis, due to unspecified organism [facility]) [A41.9] Hydronephrosis due to obstruction of ureter [N13.2]  DISCHARGE DIAGNOSIS:  Active Problems:   Pyelonephritis New onset atrial fibrillation New diagnoses of acute systolic heart failure EF 40-45%  SECONDARY DIAGNOSIS:   Past Medical History:  Diagnosis Date  . Cancer [facility])    colorectal 2017  . Colorectal cancer ([place])   . Stroke [facility])     HOSPITAL COURSE:   83 year old female with history of colorectal cancer status post colostomy who presented with altered mental status and found to have sepsis due to acute pyelonephritis   1. Sepsis with leukocytosis, tachycardia, elevated lactic acid Sepsis is due to pyelonephritis and ENTEROBACTER AEROGENES bacteremia This is sensitive to fluoroquinolones. Patient will be discharged with ciprofloxacin for 10 more days. Urine culture also contain less than 30,000 Proteus Patient is allergic to cephalosporins which causes hives  2. Acute pyelonephritis with bacteremia: Patient will continue ciprofloxacin as mentioned above  3. Right ureteral stone status post nephrostomy Routine nephrostomy tube care Patient will have follow-up with urology for removal of stone within the next 2 weeks   4. New onset atrial fibrillation with new onset systolic heart failure: Patient was evaluated cardiology. She had echocardiogram due to new onset atrial fibrillation. Echocardiogram shows ejection fraction 40-45%. He does not believe the patient had CHF exacerbation. Her heart rates are better controlled on  metoprolol. Cardiology discussed anticoagulation with daughter. At this time [facility] is deferred in light of possibility that this is reversible after current infection resolves. She will have outpatient Cardiology follow up after discharge. She will need to have daily weights. She will be referred to CHF clinic.    5. Hyperlipidemia: Continue statin  6. History CVA with some left-sided weakness: Continue aspirin and statin 7 history of colorectal cancer status post colostomy 8. Acute hypoxic Resp Failure with tachycardia: Ct scan was negative for PE/pneumonia/pulmonary edema but did show atelectasis. She will need ISS and wean O2.  9. 8.6 mm subpleural nodule seen in right middle lobe with 8 mm subpleural nodule seen in left upper lobe. Non-contrast chest CT at 3-6 months is recommended    DISCHARGE CONDITIONS AND DIET:   Stable for discharge on heart healthy diet  CONSULTS OBTAINED:  Treatment Team:  Hollice Espy, MD Alyson Ingles Candee Furbish, MD Isaias Cowman, MD  DRUG ALLERGIES:   Allergies  Allergen Reactions  . Cephalexin Hives  . Quinine Derivatives     DISCHARGE MEDICATIONS:   Current Discharge Medication List    START taking these medications   Details  ciprofloxacin (CIPRO) 500 MG tablet Take 1 tablet (500 mg total) by mouth 2 (two) times daily. Qty: 20 tablet, Refills: 0    metoprolol tartrate (LOPRESSOR) 25 MG tablet Take 1 tablet (25 mg total) by mouth 2 (two) times daily. Qty: 60 tablet, Refills: 0      CONTINUE these medications which have NOT CHANGED   Details  acetaminophen (TYLENOL) 500 MG tablet Take 1,000 mg by mouth every 6 (six) hours as needed for fever.    Ascorbic Acid (VITAMIN C WITH ROSE HIPS) 500 MG tablet Take 500 mg  by mouth daily.    aspirin EC 81 MG tablet Take 81 mg by mouth daily.    atorvastatin (LIPITOR) 80 MG tablet Take 80 mg by mouth daily.    diphenhydramine-acetaminophen (TYLENOL PM) 25-500 MG TABS tablet Take 1  tablet by mouth at bedtime.    ferrous sulfate 325 (65 FE) MG tablet Take 325 mg by mouth daily with breakfast.    loratadine (CLARITIN) 10 MG tablet Take 10 mg by mouth daily.    Multiple Vitamin (MULTIVITAMIN) tablet Take 1 tablet by mouth daily.          Today   CHIEF COMPLAINT:   Patient was found to have new onset atrial fibrillation. She has been followed by cardiology.   VITAL SIGNS:  Blood pressure (!) 143/88, pulse (!) 112, temperature 98.4 F (36.9 C), temperature source Oral, resp. rate 16, height 5\' 6"  (1.676 m), weight 88.9 kg (196 lb), SpO2 96 %.   REVIEW OF SYSTEMS:  Review of Systems  Constitutional: Positive for malaise/fatigue. Negative for chills and fever.  HENT: Negative.  Negative for ear discharge, ear pain, hearing loss, nosebleeds and sore throat.   Eyes: Negative.  Negative for blurred vision and pain.  Respiratory: Negative.  Negative for cough, hemoptysis, shortness of breath and wheezing.   Cardiovascular: Negative.  Negative for chest pain, palpitations and leg swelling.  Gastrointestinal: Negative.  Negative for abdominal pain, blood in stool, diarrhea, nausea and vomiting.  Genitourinary: Negative.  Negative for dysuria.  Musculoskeletal: Negative.  Negative for back pain.  Skin: Negative.   Neurological: Positive for weakness. Negative for dizziness, tremors, speech change, focal weakness, seizures and headaches.  Endo/Heme/Allergies: Negative.  Does not bruise/bleed easily.  Psychiatric/Behavioral: Negative.  Negative for depression, hallucinations and suicidal ideas.     PHYSICAL EXAMINATION:  GENERAL:  83 y.o.-year-old patient lying in the bed with no acute distress.  NECK:  Supple, no jugular venous distention. No thyroid enlargement, no tenderness.  LUNGS: Normal breath sounds bilaterally, no wheezing, rales,rhonchi  No use of accessory muscles of respiration.  CARDIOVASCULAR: Irregular irregular. No murmurs, rubs, or gallops.   ABDOMEN: Soft, non-tender, non-distended. Bowel sounds present. No organomegaly or mass.  Nephrostomy tube placed Colostomy bag EXTREMITIES: No pedal edema, cyanosis, or clubbing.  PSYCHIATRIC: The patient is alert and oriented x 3.  SKIN: No obvious rash, lesion, or ulcer.   DATA REVIEW:   CBC  Recent Labs Lab 01/11/17 0030  WBC 13.6*  HGB 11.9*  HCT 35.4  PLT 100*    Chemistries   Recent Labs Lab 01/10/17 1648 01/11/17 0030  NA 136 135  K 3.9 3.9  CL 101 104  CO2 27 23  GLUCOSE 170* 171*  BUN 28* 28*  CREATININE 1.17* 1.07*  CALCIUM 8.8* 8.0*  AST 45*  --   ALT 35  --   ALKPHOS 48  --   BILITOT 1.7*  --     Cardiac Enzymes  Recent Labs Lab 01/10/17 1701  TROPONINI 0.03*    Microbiology Results  @[email or web address] @  RADIOLOGY:  Ct Angio Chest Pe W Or Wo Contrast  Result Date: 01/13/2017 CLINICAL DATA:  Shortness of breath. EXAM: CT ANGIOGRAPHY CHEST WITH CONTRAST TECHNIQUE: Multidetector CT imaging of the chest was performed using the standard protocol during bolus administration of intravenous contrast. Multiplanar CT image reconstructions and MIPs were obtained to evaluate the vascular anatomy. CONTRAST:  75 mL of Isovue 370 intravenously. COMPARISON:  None. FINDINGS: Cardiovascular: There is no evidence of large central pulmonary  embolus. Smaller peripheral pulmonary emboli cannot be excluded due to respiratory motion artifact. Mediastinum/Nodes: No enlarged mediastinal, hilar, or axillary lymph nodes. Thyroid gland, trachea, and esophagus demonstrate no significant findings. Lungs/Pleura: No pneumothorax or pleural effusion is noted. Mild subsegmental atelectasis is noted posteriorly in the right upper lobe. 6 mm subpleural nodule is seen in right middle lobe best seen on image number 49 of series 6. 8 mm subpleural nodule is seen in left upper lobe laterally best seen on image number 35 of series 8. Upper Abdomen: No acute abnormality. Musculoskeletal: No chest  wall abnormality. No acute or significant osseous findings. Review of the MIP images confirms the above findings. IMPRESSION: No evidence of large central pulmonary embolus. Smaller peripheral pulmonary emboli cannot be excluded on the basis of this exam due to respiratory motion artifact. Mild subsegmental atelectasis is noted posteriorly in the right upper lobe. 6 mm subpleural nodule seen in right middle lobe with 8 mm subpleural nodule seen in left upper lobe. Non-contrast chest CT at 3-6 months is recommended. If the nodules are stable at time of repeat CT, then future CT at 18-24 months (from today's scan) is considered optional for low-risk patients, but is recommended for high-risk patients. This recommendation follows the consensus statement: Guidelines for Management of Incidental Pulmonary Nodules Detected on CT Images: From the Fleischner Society 2017; Radiology 2017; 284:228-243. Electronically Signed   By: Marijo Conception, M.D.   On: 01/13/2017 13:20      Current Discharge Medication List    START taking these medications   Details  ciprofloxacin (CIPRO) 500 MG tablet Take 1 tablet (500 mg total) by mouth 2 (two) times daily. Qty: 20 tablet, Refills: 0    metoprolol tartrate (LOPRESSOR) 25 MG tablet Take 1 tablet (25 mg total) by mouth 2 (two) times daily. Qty: 60 tablet, Refills: 0      CONTINUE these medications which have NOT CHANGED   Details  acetaminophen (TYLENOL) 500 MG tablet Take 1,000 mg by mouth every 6 (six) hours as needed for fever.    Ascorbic Acid (VITAMIN C WITH ROSE HIPS) 500 MG tablet Take 500 mg by mouth daily.    aspirin EC 81 MG tablet Take 81 mg by mouth daily.    atorvastatin (LIPITOR) 80 MG tablet Take 80 mg by mouth daily.    diphenhydramine-acetaminophen (TYLENOL PM) 25-500 MG TABS tablet Take 1 tablet by mouth at bedtime.    ferrous sulfate 325 (65 FE) MG tablet Take 325 mg by mouth daily with breakfast.    loratadine (CLARITIN) 10 MG tablet  Take 10 mg by mouth daily.    Multiple Vitamin (MULTIVITAMIN) tablet Take 1 tablet by mouth daily.          Management plans discussed with the patient and She is in agreement. Stable for discharge to skilled nursing facility  Patient should follow up with PCP and urology  CODE STATUS:     Code Status Orders        Start     Ordered   01/10/17 2323  Full code  Continuous     01/10/17 2322    Code Status History    Date Active Date Inactive Code Status Order ID Comments User Context   This patient has a current code status but no historical code status.    Advance Directive Documentation     Most Recent Value  Type of Advance Directive  Healthcare Power of Attorney, Living will  Pre-existing out of facility  DNR order (yellow form or pink MOST form)  -  "MOST" Form in Place?  -      TOTAL TIME TAKING CARE OF THIS PATIENT: 37 minutes.    Note: This dictation was prepared with Dragon dictation along with smaller phrase technology. Any transcriptional errors that result from this process are unintentional.  Arayna Illescas M.D on 01/14/2017 at 8:36 AM  Between 7am to 6pm - Pager - [phone] After 6pm go to [email or web address] - password [email or web address] [facility]  Office  [phone]  CC: Primary care physician; Verita Lamb, NP

## 2017-01-13 NOTE — Progress Notes (Signed)
Pharmacy Antibiotic Note  Megan Hamilton is a 83 y.o. female admitted on 01/10/2017 with UTI.  Pharmacy has been consulted for ciprofloxacin dosing.  Plan: Change to ciprofloxacin 500 mg po BID for discharge.   Height: 5\' 6"  (167.6 cm) Weight: 196 lb (88.9 kg) IBW/kg (Calculated) : 59.3  Temp (24hrs), Avg:98.9 F (37.2 C), Min:98.1 F (36.7 C), Max:99.3 F (37.4 C)   Recent Labs Lab 01/10/17 1648 01/11/17 0030 01/11/17 0401 01/11/17 1101 01/11/17 1335 01/12/17 1046 01/12/17 1338  WBC 16.8* 13.6*  --   --   --   --   --   CREATININE 1.17* 1.07*  --   --   --   --   --   LATICACIDVEN 2.6* 2.7* 2.7* 2.3* 2.5* 2.0* 1.5    Estimated Creatinine Clearance: 46.3 mL/min (A) (by C-G formula based on SCr of 1.07 mg/dL (H)).    Allergies  Allergen Reactions  . Cephalexin Hives  . Quinine Derivatives     Thank you for allowing pharmacy to be a part of this patient's care.  Laural Benes, Pharm.D., BCPS Clinical Pharmacist 01/13/2017 9:33 AM

## 2017-01-13 NOTE — Progress Notes (Signed)
PT Cancellation Note  Patient Details Name: NAMYA VOGES MRN: [id number] DOB: [birth year]   Cancelled Treatment:    Reason Eval/Treat Not Completed: Medical issues which prohibited therapy. Per nursing, pt presented with new onset A-fib and is not appropriate to work with PT today. PT will reattempt treatment at a later date/time as able when pt is medically appropriate.   Jaelani Posa, SPT 01/13/2017, 3:47 PM

## 2017-01-13 NOTE — Consult Note (Signed)
[facility]  CARDIOLOGY CONSULT NOTE  Patient ID: Megan Hamilton MRN: [id number] DOB/AGE: [birth year] 83 y.o.  Admit date: 01/10/2017 Referring Physician Mody Primary Physician Verita Lamb, NP Primary Cardiologist None per patient Reason for Consultation New-onset atrial fibrillation  HPI: 83 year old female referred for new-onset atrial fibrillation. Patient has a history of colorectal cancer, status post colostomy, hyperlipidemia, and strokes of uncertain etiology, with no significant cardiac history such as past MI, known CAD, prior cardiac catheterization, or known arrhythmias. The patient was admitted to [facility] on 01/10/2017 for sepsis due to acute pyelonephritis and underwent percutaneous nephrostomy. The patient was noted to be in new-onset atrial fibrillation today, with current rate of 115 bpm on metoprolol tartrate 12.5 mg twice daily. The patient has a chads vasc score of 5, currently on aspirin. The patient's daughter provided the history, and stated that the patient had not been complaining of palpitations, shortness of breath, or chest pain.         Past Medical History:  Diagnosis Date  . Cancer [facility])    colorectal 2017  . Colorectal cancer ([place])   . Stroke [facility])     Past Surgical History:  Procedure Laterality Date  . ABDOMINAL HYSTERECTOMY    . CHOLECYSTECTOMY    . COLON SURGERY    . IR NEPHROSTOMY PLACEMENT RIGHT  01/10/2017    Prescriptions Prior to Admission  Medication Sig Dispense Refill Last Dose  . acetaminophen (TYLENOL) 500 MG tablet Take 1,000 mg by mouth every 6 (six) hours as needed for fever.   PRN at PRN  . Ascorbic Acid (VITAMIN C WITH ROSE HIPS) 500 MG tablet Take 500 mg by mouth daily.   01/10/2017 at 0800  . aspirin EC 81 MG tablet Take 81 mg by mouth daily.   01/10/2017 at 0800  . atorvastatin (LIPITOR) 80 MG tablet Take 80 mg by mouth daily.   01/09/2017 at 2000  . diphenhydramine-acetaminophen (TYLENOL PM) 25-500 MG TABS tablet Take 1 tablet by  mouth at bedtime.   01/09/2017 at 2000  . ferrous sulfate 325 (65 FE) MG tablet Take 325 mg by mouth daily with breakfast.   01/10/2017 at 0800  . loratadine (CLARITIN) 10 MG tablet Take 10 mg by mouth daily.   01/10/2017 at 0800  . Multiple Vitamin (MULTIVITAMIN) tablet Take 1 tablet by mouth daily.   01/10/2017 at 0800   Social History   Social History  . Marital status: Widowed    Spouse name: N/A  . Number of children: N/A  . Years of education: N/A   Occupational History  . Not on file.   Social History Main Topics  . Smoking status: Never Smoker  . Smokeless tobacco: Never Used  . Alcohol use No  . Drug use: No  . Sexual activity: Not on file   Other Topics Concern  . Not on file   Social History Narrative  . No narrative on file    Family History  Problem Relation Age of Onset  . Diabetes Mother   . Stroke Father       Review of systems discussed with daughter; otherwise negative    PHYSICAL EXAM  General: In no acute distress, resting comfortably, daughter at bedside HEENT:  Normocephalic and atramatic Neck:  No JVD.  Lungs: Normal effort of breathing, on nasal cannula Heart: Irregularly irregular Abdomen: Bowel sounds are positive Msk:  Patient lying supine in bed Extremities: No clubbing, cyanosis or edema.   Neuro: Patient asleep Psych:  Patient asleep  Labs:   Lab Results  Component Value Date   WBC 13.6 (H) 01/11/2017   HGB 11.9 (L) 01/11/2017   HCT 35.4 01/11/2017   MCV 91.6 01/11/2017   PLT 100 (L) 01/11/2017    Recent Labs Lab 01/10/17 1648 01/11/17 0030  NA 136 135  K 3.9 3.9  CL 101 104  CO2 27 23  BUN 28* 28*  CREATININE 1.17* 1.07*  CALCIUM 8.8* 8.0*  PROT 7.2  --   BILITOT 1.7*  --   ALKPHOS 48  --   ALT 35  --   AST 45*  --   GLUCOSE 170* 171*   Lab Results  Component Value Date   TROPONINI 0.03 ([place]) 01/10/2017   No results found for: CHOL No results found for: HDL No results found for: LDLCALC No results found for:  TRIG No results found for: CHOLHDL No results found for: LDLDIRECT    Radiology: Dg Chest 1 View  Result Date: 01/10/2017 CLINICAL DATA:  Altered mental status.  Sepsis.  Colorectal cancer. EXAM: CHEST 1 VIEW COMPARISON:  None. FINDINGS: Right hemidiaphragm elevation. Mildly degraded exam due to AP portable technique and patient body habitus. The Chin overlies the apices. Patient rotated to the right. Cardiomegaly accentuated by AP portable technique. Atherosclerosis in the transverse aorta. No pleural effusion or pneumothorax. No congestive failure. Mild left base scarring laterally. IMPRESSION: No acute findings. Cardiomegaly without congestive failure. Low lung volumes. Aortic atherosclerosis. Electronically Signed   By: Abigail Miyamoto M.D.   On: 01/10/2017 17:17   Ct Angio Chest Pe W Or Wo Contrast  Result Date: 01/13/2017 CLINICAL DATA:  Shortness of breath. EXAM: CT ANGIOGRAPHY CHEST WITH CONTRAST TECHNIQUE: Multidetector CT imaging of the chest was performed using the standard protocol during bolus administration of intravenous contrast. Multiplanar CT image reconstructions and MIPs were obtained to evaluate the vascular anatomy. CONTRAST:  75 mL of Isovue 370 intravenously. COMPARISON:  None. FINDINGS: Cardiovascular: There is no evidence of large central pulmonary embolus. Smaller peripheral pulmonary emboli cannot be excluded due to respiratory motion artifact. Mediastinum/Nodes: No enlarged mediastinal, hilar, or axillary lymph nodes. Thyroid gland, trachea, and esophagus demonstrate no significant findings. Lungs/Pleura: No pneumothorax or pleural effusion is noted. Mild subsegmental atelectasis is noted posteriorly in the right upper lobe. 6 mm subpleural nodule is seen in right middle lobe best seen on image number 49 of series 6. 8 mm subpleural nodule is seen in left upper lobe laterally best seen on image number 35 of series 8. Upper Abdomen: No acute abnormality. Musculoskeletal: No chest  wall abnormality. No acute or significant osseous findings. Review of the MIP images confirms the above findings. IMPRESSION: No evidence of large central pulmonary embolus. Smaller peripheral pulmonary emboli cannot be excluded on the basis of this exam due to respiratory motion artifact. Mild subsegmental atelectasis is noted posteriorly in the right upper lobe. 6 mm subpleural nodule seen in right middle lobe with 8 mm subpleural nodule seen in left upper lobe. Non-contrast chest CT at 3-6 months is recommended. If the nodules are stable at time of repeat CT, then future CT at 18-24 months (from today's scan) is considered optional for low-risk patients, but is recommended for high-risk patients. This recommendation follows the consensus statement: Guidelines for Management of Incidental Pulmonary Nodules Detected on CT Images: From the Fleischner Society 2017; Radiology 2017; 284:228-243. Electronically Signed   By: Marijo Conception, M.D.   On: 01/13/2017 13:20   Ct Abdomen Pelvis W Contrast  Result Date: 01/10/2017 CLINICAL DATA:  Acute onset of altered mental status. Abdominal sepsis. Initial encounter. EXAM: CT ABDOMEN AND PELVIS WITH CONTRAST TECHNIQUE: Multidetector CT imaging of the abdomen and pelvis was performed using the standard protocol following bolus administration of intravenous contrast. CONTRAST:  15mL ISOVUE-300 IOPAMIDOL (ISOVUE-300) INJECTION 61% COMPARISON:  None. FINDINGS: Lower chest: Minimal bibasilar atelectasis is noted. A tiny hiatal hernia is seen. The visualized portions of the mediastinum are grossly unremarkable. Hepatobiliary: The liver is unremarkable in appearance. The patient is status post cholecystectomy, with clips noted at the gallbladder fossa. The common bile duct remains normal in caliber. Vague soft tissue inflammation tracks about the inferior aspect of the liver, reflecting the adjacent right renal process. Pancreas: The pancreas is within normal limits. Spleen: The  spleen is unremarkable in appearance. Adrenals/Urinary Tract: The adrenal glands are grossly unremarkable in appearance. There is diffusely heterogeneous enhancement of the right kidney, with right renal enlargement and diffuse wall thickening along the right renal pelvis, compatible with right-sided pyelonephritis. Surrounding stranding is noted, tracking inferiorly along the right paracolic gutter. A large 1.5 cm stone is noted at the right renal pelvis. There is diffuse wall thickening along the course of the right ureter, reflecting right-sided ureteritis. Minimal right-sided hydronephrosis is noted. The left kidney is grossly unremarkable in appearance. No additional renal or ureteral stones are identified. Stomach/Bowel: The appendix is not well seen. Vague soft tissue inflammation about the cecum appears to reflect the adjacent right ureteral process. The patient's left lower quadrant colostomy is grossly unremarkable in appearance, aside from herniation of a short segment of small bowel into the colostomy site. There is no evidence for bowel obstruction. Minimal diverticulosis is noted along the distal descending colon. Remaining visualized small bowel loops are grossly unremarkable in appearance. The stomach is decompressed and grossly unremarkable. Vascular/Lymphatic: Scattered calcification is seen along the abdominal aorta and its branches. The abdominal aorta is otherwise grossly unremarkable. The inferior vena cava is grossly unremarkable. No retroperitoneal lymphadenopathy is seen. No pelvic sidewall lymphadenopathy is identified. Reproductive: The patient is status post hysterectomy. There is diffuse soft tissue inflammation about the decompressed bladder, raising concern for cystitis. Vague nodular soft tissue density is seen within the presacral region, with associated trace free fluid, raising question for metastatic disease. Other: No additional soft tissue abnormalities are seen.  Musculoskeletal: No acute osseous abnormalities are identified. Facet disease is noted at the lower lumbar spine. The visualized musculature is unremarkable in appearance. IMPRESSION: 1. Diffusely heterogeneous enhancement of the right kidney, with right renal enlargement and diffuse wall thickening along the right renal pelvis, compatible with right-sided pyelonephritis. Large 1.5 cm stone at the right renal pelvis, with minimal right-sided hydronephrosis. Diffuse wall thickening along the right ureter, reflecting right-sided ureteritis. 2. Diffuse soft tissue inflammation about the decompressed bladder, raising concern for cystitis. 3. Vague nodular soft tissue density at the presacral region, with associated trace free fluid, raising question for metastatic disease. Would correlate with the patient's symptoms, and consider PET/CT for further evaluation, as deemed clinically appropriate. 4. Vague soft tissue inflammation tracks about the inferior aspect of the liver, with fluid tracking along the right paracolic gutter, likely reflecting the right renal process. 5. Tiny hiatal hernia noted. 6. Left lower quadrant colostomy is grossly unremarkable in appearance, aside from herniation of a short segment of small bowel into the colostomy site. No evidence of bowel obstruction. 7. Minimal diverticulosis at the distal descending colon, without evidence of diverticulitis. 8. Scattered aortic atherosclerosis.  Electronically Signed   By: Garald Balding M.D.   On: 01/10/2017 19:06   Ir Nephrostomy Placement Right  Result Date: 01/12/2017 CLINICAL DATA:  Sepsis and obstructing calculus at the right ureteropelvic junction. EXAM: 1. ULTRASOUND GUIDANCE FOR PUNCTURE OF THE RIGHT RENAL COLLECTING SYSTEM. 2. RIGHT PERCUTANEOUS NEPHROSTOMY TUBE PLACEMENT. COMPARISON:  CT of the abdomen and pelvis on 01/10/2017 ANESTHESIA/SEDATION: 0.5 mg IV Versed; 25 mcg IV Fentanyl. Total Moderate Sedation Time 30 minutes. The patient's  level of consciousness and physiologic status were continuously monitored during the procedure by Radiology nursing. CONTRAST:  15 ml Isovue 300 MEDICATIONS: No additional medications administered. FLUOROSCOPY TIME:  1 minutes and 6 seconds.  39mG y. PROCEDURE: The procedure, risks, benefits, and alternatives were explained to the patient's son and daughter. Questions regarding the procedure were encouraged and answered. The patient's son understands and consents to the procedure. The right flank region was prepped with Betadine in a sterile fashion, and a sterile drape was applied covering the operative field. A sterile gown and sterile gloves were used for the procedure. Local anesthesia was provided with 1% Lidocaine. Ultrasound was used to localize the right kidney. Under direct ultrasound guidance, a 21 gauge needle was advanced into the renal collecting system. Ultrasound image documentation was performed. Aspiration of urine sample was performed followed by contrast injection. A transitional dilator was advanced over a guidewire. Percutaneous tract dilatation was then performed over the guidewire. A 10 -French percutaneous nephrostomy tube was then advanced and formed in the collecting system. Catheter position was confirmed by fluoroscopy after contrast injection. The catheter was secured at the skin with a Prolene retention suture and Stat-Lock device. A gravity bag was placed. COMPLICATIONS: None. FINDINGS: After puncture of the right renal collecting system from a lower pole approach, aspiration yielded grossly purulent urine. A sample was sent for culture analysis. The nephrostomy tube was formed in the renal pelvis. IMPRESSION: Placement of 10 French percutaneous nephrostomy tube on the right which was formed at the level of the renal pelvis. There was return of grossly purulent urine consistent with pyonephrosis. A sample was sent for culture analysis. Electronically Signed   By: Aletta Edouard M.D.    On: 01/12/2017 08:13    EKG: Atrial fibrillation, rate 115 bpm  ASSESSMENT AND PLAN:  1. New-onset atrial fibrillation in the setting of sepsis due to pyelonephritis, rate appropriately compensatory, with a chads vasc score of 5, currently on aspirin, with a history of easy bleeding. 2. Sepsis due to pyelonephritis, status post percutaneous nephrostomy 3. History of CVA of uncertain etiology   Recommendations: 1. Continue current therapy. 2. Review 2D echocardiogram 3. Continue metoprolol tartrate for rate control for now 4. Discussed in detail with patient's daughter the risks, benefits, and alternatives of chronic anticoagulation with warfarin versus a novel anticoagulant. At this time, it was agreed upon to defer full dose anticoagulation in light of possible reversibility of atrial fibrillation once pyelonephritis resolves, her easy bleeding history, and possible upcoming invasive procedure for management of kidney stone, but also taking into consideration her history of strokes of uncertain etiology and increased risk of recurrence. We will discuss anticoagulation further as outpatient. 5. Continue aspirin for now 6. Further recommendations pending results of echocardiogram.   Signed: Clabe Seal, PA-C 01/13/2017, 4:54 PM

## 2017-01-13 NOTE — Progress Notes (Signed)
Urology Consult Follow Up  Subjective: Patient seen and examined early this morning. Appeared well, back to baseline. Right nephrostomy tube draining well. Eating grits for breakfast.  Anti-infectives: Anti-infectives    Start     Dose/Rate Route Frequency Ordered Stop   01/13/17 0945  ciprofloxacin (CIPRO) tablet 500 mg     500 mg Oral 2 times daily 01/13/17 0933     01/13/17 0000  ciprofloxacin (CIPRO) 500 MG tablet     500 mg Oral 2 times daily 01/13/17 0952 01/23/17 2359   01/12/17 1800  levofloxacin (LEVAQUIN) IVPB 750 mg  Status:  Discontinued     750 mg 100 mL/hr over 90 Minutes Intravenous Every 48 hours 01/10/17 2351 01/13/17 0933   01/10/17 1715  levofloxacin (LEVAQUIN) IVPB 750 mg     750 mg 100 mL/hr over 90 Minutes Intravenous  Once 01/10/17 1704 01/10/17 1844   01/10/17 1715  aztreonam (AZACTAM) 2 g in dextrose 5 % 50 mL IVPB     2 g 100 mL/hr over 30 Minutes Intravenous  Once 01/10/17 1704 01/10/17 1851   01/10/17 1715  vancomycin (VANCOCIN) IVPB 1000 mg/200 mL premix     1,000 mg 200 mL/hr over 60 Minutes Intravenous  Once 01/10/17 1704 01/10/17 1814      Current Facility-Administered Medications  Medication Dose Route Frequency Provider Last Rate Last Dose  . acetaminophen (TYLENOL) tablet 650 mg  650 mg Oral Q6H PRN Henreitta Leber, MD   650 mg at 01/11/17 1734   Or  . acetaminophen (TYLENOL) suppository 650 mg  650 mg Rectal Q6H PRN Henreitta Leber, MD      . aspirin EC tablet 81 mg  81 mg Oral Daily Henreitta Leber, MD   81 mg at 01/13/17 1031  . atorvastatin (LIPITOR) tablet 80 mg  80 mg Oral Daily Henreitta Leber, MD   80 mg at 01/13/17 1032  . ciprofloxacin (CIPRO) tablet 500 mg  500 mg Oral BID Bettey Costa, MD   500 mg at 01/13/17 1031  . docusate sodium (COLACE) capsule 100 mg  100 mg Oral Daily Bettey Costa, MD   100 mg at 01/13/17 1032  . enoxaparin (LOVENOX) injection 40 mg  40 mg Subcutaneous Q24H Henreitta Leber, MD   40 mg at 01/12/17 2028  .  ferrous sulfate tablet 325 mg  325 mg Oral Q breakfast Henreitta Leber, MD   325 mg at 01/13/17 1032  . loratadine (CLARITIN) tablet 10 mg  10 mg Oral Daily Henreitta Leber, MD   10 mg at 01/13/17 1032  . metoprolol tartrate (LOPRESSOR) tablet 12.5 mg  12.5 mg Oral BID Bettey Costa, MD   12.5 mg at 01/13/17 1442  . morphine 2 MG/ML injection 2 mg  2 mg Intravenous Q4H PRN Henreitta Leber, MD      . multivitamin with minerals tablet 1 tablet  1 tablet Oral Daily Henreitta Leber, MD   1 tablet at 01/13/17 1032  . ondansetron (ZOFRAN) tablet 4 mg  4 mg Oral Q6H PRN Henreitta Leber, MD       Or  . ondansetron (ZOFRAN) injection 4 mg  4 mg Intravenous Q6H PRN Henreitta Leber, MD   4 mg at 01/11/17 1741  . traMADol (ULTRAM) tablet 50 mg  50 mg Oral Q6H PRN Mody, Sital, MD      . vitamin C (ASCORBIC ACID) tablet 500 mg  500 mg Oral Daily Sainani, Belia Heman, MD  500 mg at 01/13/17 1032     Objective: Vital signs in last 24 hours: Temp:  [97.8 F (36.6 C)-99.3 F (37.4 C)] 98.7 F (37.1 C) (05/09 1613) Pulse Rate:  [83-150] 113 (05/09 1613) Resp:  [20] 20 (05/09 0448) BP: (121-156)/(73-98) 156/98 (05/09 1613) SpO2:  [91 %-97 %] 97 % (05/09 1613)  Intake/Output from previous day: 05/08 0701 - 05/09 0700 In: 370 [P.O.:360] Out: 300 [Urine:300] Intake/Output this shift: Total I/O In: 480 [P.O.:240; Other:240] Out: -    Physical Exam  Constitutional: She is well-developed, well-nourished, and in no distress.  HENT:  Head: Normocephalic and atraumatic.  Pulmonary/Chest: Effort normal.  Genitourinary:  Genitourinary Comments: Right nephrostomy draining yellow urine  Neurological: She is alert.  Vitals reviewed.   Lab Results:   Recent Labs  01/11/17 0030  WBC 13.6*  HGB 11.9*  HCT 35.4  PLT 100*   BMET  Recent Labs  01/11/17 0030  NA 135  K 3.9  CL 104  CO2 23  GLUCOSE 171*  BUN 28*  CREATININE 1.07*  CALCIUM 8.0*   PT/INR No results for input(s): LABPROT,  INR in the last 72 hours. Blood and urine cultures pending.  Studies/Results: Ct Angio Chest Pe W Or Wo Contrast  Result Date: 01/13/2017 CLINICAL DATA:  Shortness of breath. EXAM: CT ANGIOGRAPHY CHEST WITH CONTRAST TECHNIQUE: Multidetector CT imaging of the chest was performed using the standard protocol during bolus administration of intravenous contrast. Multiplanar CT image reconstructions and MIPs were obtained to evaluate the vascular anatomy. CONTRAST:  75 mL of Isovue 370 intravenously. COMPARISON:  None. FINDINGS: Cardiovascular: There is no evidence of large central pulmonary embolus. Smaller peripheral pulmonary emboli cannot be excluded due to respiratory motion artifact. Mediastinum/Nodes: No enlarged mediastinal, hilar, or axillary lymph nodes. Thyroid gland, trachea, and esophagus demonstrate no significant findings. Lungs/Pleura: No pneumothorax or pleural effusion is noted. Mild subsegmental atelectasis is noted posteriorly in the right upper lobe. 6 mm subpleural nodule is seen in right middle lobe best seen on image number 49 of series 6. 8 mm subpleural nodule is seen in left upper lobe laterally best seen on image number 35 of series 8. Upper Abdomen: No acute abnormality. Musculoskeletal: No chest wall abnormality. No acute or significant osseous findings. Review of the MIP images confirms the above findings. IMPRESSION: No evidence of large central pulmonary embolus. Smaller peripheral pulmonary emboli cannot be excluded on the basis of this exam due to respiratory motion artifact. Mild subsegmental atelectasis is noted posteriorly in the right upper lobe. 6 mm subpleural nodule seen in right middle lobe with 8 mm subpleural nodule seen in left upper lobe. Non-contrast chest CT at 3-6 months is recommended. If the nodules are stable at time of repeat CT, then future CT at 18-24 months (from today's scan) is considered optional for low-risk patients, but is recommended for high-risk  patients. This recommendation follows the consensus statement: Guidelines for Management of Incidental Pulmonary Nodules Detected on CT Images: From the Fleischner Society 2017; Radiology 2017; 284:228-243. Electronically Signed   By: Marijo Conception, M.D.   On: 01/13/2017 13:20   Recent Results (from the past 240 hour(s))  Culture, blood (Routine x 2)     Status: Abnormal   Collection Time: 01/10/17  4:48 PM  Result Value Ref Range Status   Specimen Description BLOOD  Final   Special Requests NONE  Final   Culture  Setup Time   Final    GRAM NEGATIVE RODS IN BOTH  AEROBIC AND ANAEROBIC BOTTLES CRITICAL VALUE NOTED.  VALUE IS CONSISTENT WITH PREVIOUSLY REPORTED AND CALLED VALUE.    Culture (A)  Final    ENTEROBACTER AEROGENES SUSCEPTIBILITIES PERFORMED ON PREVIOUS CULTURE WITHIN THE LAST 5 DAYS. Performed at [facility], [place] [address], [place], [place] [address]    Report Status 01/13/2017 FINAL  Final  Culture, blood (Routine x 2)     Status: Abnormal   Collection Time: 01/10/17  4:48 PM  Result Value Ref Range Status   Specimen Description BLOOD  Final   Special Requests NONE  Final   Culture  Setup Time   Final    GRAM NEGATIVE RODS IN BOTH AEROBIC AND ANAEROBIC BOTTLES CRITICAL RESULT CALLED TO, READ BACK BY AND VERIFIED WITH: HANK ZOMPA 01/11/17 1856 SGD Performed at [facility], [place] [address] [place],  [address]    Culture ENTEROBACTER AEROGENES (A)  Final   Report Status 01/13/2017 FINAL  Final   Organism ID, Bacteria ENTEROBACTER AEROGENES  Final      Susceptibility   Enterobacter aerogenes - MIC*    CEFAZOLIN >=64 RESISTANT Resistant     CEFEPIME <=1 SENSITIVE Sensitive     CEFTAZIDIME <=1 SENSITIVE Sensitive     CEFTRIAXONE <=1 SENSITIVE Sensitive     CIPROFLOXACIN <=0.25 SENSITIVE Sensitive     GENTAMICIN <=1 SENSITIVE Sensitive     IMIPENEM 2 SENSITIVE Sensitive     TRIMETH/SULFA <=20 SENSITIVE Sensitive     PIP/TAZO <=4 SENSITIVE Sensitive      * ENTEROBACTER AEROGENES  Blood Culture ID Panel (Reflexed)     Status: Abnormal   Collection Time: 01/10/17  4:48 PM  Result Value Ref Range Status   Enterococcus species NOT DETECTED NOT DETECTED Final   Listeria monocytogenes NOT DETECTED NOT DETECTED Final   Staphylococcus species NOT DETECTED NOT DETECTED Final   Staphylococcus aureus NOT DETECTED NOT DETECTED Final   Streptococcus species NOT DETECTED NOT DETECTED Final   Streptococcus agalactiae NOT DETECTED NOT DETECTED Final   Streptococcus pneumoniae NOT DETECTED NOT DETECTED Final   Streptococcus pyogenes NOT DETECTED NOT DETECTED Final   Acinetobacter baumannii NOT DETECTED NOT DETECTED Final   Enterobacteriaceae species DETECTED (A) NOT DETECTED Final    Comment: Enterobacteriaceae represent a large family of gram negative bacteria, not a single organism. Refer to culture for further identification. CRITICAL RESULT CALLED TO, READ BACK BY AND VERIFIED WITH: HANK ZOMPA 01/11/17 0856 SGD    Enterobacter cloacae complex NOT DETECTED NOT DETECTED Final   Escherichia coli NOT DETECTED NOT DETECTED Final   Klebsiella oxytoca NOT DETECTED NOT DETECTED Final   Klebsiella pneumoniae NOT DETECTED NOT DETECTED Final   Proteus species NOT DETECTED NOT DETECTED Final   Serratia marcescens NOT DETECTED NOT DETECTED Final   Carbapenem resistance NOT DETECTED NOT DETECTED Final   Haemophilus influenzae NOT DETECTED NOT DETECTED Final   Neisseria meningitidis NOT DETECTED NOT DETECTED Final   Pseudomonas aeruginosa NOT DETECTED NOT DETECTED Final   Candida albicans NOT DETECTED NOT DETECTED Final   Candida glabrata NOT DETECTED NOT DETECTED Final   Candida krusei NOT DETECTED NOT DETECTED Final   Candida parapsilosis NOT DETECTED NOT DETECTED Final   Candida tropicalis NOT DETECTED NOT DETECTED Final  Urine culture     Status: Abnormal   Collection Time: 01/10/17  5:01 PM  Result Value Ref Range Status   Specimen Description URINE,  CLEAN CATCH  Final   Special Requests NONE  Final  Culture >=100,000 COLONIES/mL ENTEROBACTER AEROGENES (A)  Final   Report Status 01/13/2017 FINAL  Final   Organism ID, Bacteria ENTEROBACTER AEROGENES (A)  Final      Susceptibility   Enterobacter aerogenes - MIC*    CEFAZOLIN >=64 RESISTANT Resistant     CEFTRIAXONE <=1 SENSITIVE Sensitive     CIPROFLOXACIN <=0.25 SENSITIVE Sensitive     GENTAMICIN <=1 SENSITIVE Sensitive     IMIPENEM 2 SENSITIVE Sensitive     NITROFURANTOIN 64 INTERMEDIATE Intermediate     TRIMETH/SULFA <=20 SENSITIVE Sensitive     PIP/TAZO <=4 SENSITIVE Sensitive     * >=100,000 COLONIES/mL ENTEROBACTER AEROGENES  Urine culture     Status: Abnormal   Collection Time: 01/10/17 10:17 PM  Result Value Ref Range Status   Specimen Description URINE, CATHETERIZED RIGHT NEPHROSTOMY  Final   Special Requests Normal  Final   Culture (A)  Final    30,000 COLONIES/mL PROTEUS MIRABILIS 10,000 COLONIES/mL ENTEROBACTER AEROGENES    Report Status 01/13/2017 FINAL  Final   Organism ID, Bacteria PROTEUS MIRABILIS (A)  Final   Organism ID, Bacteria ENTEROBACTER AEROGENES (A)  Final      Susceptibility   Enterobacter aerogenes - MIC*    CEFAZOLIN RESISTANT Resistant     CEFTRIAXONE <=1 SENSITIVE Sensitive     CIPROFLOXACIN <=0.25 SENSITIVE Sensitive     GENTAMICIN <=1 SENSITIVE Sensitive     IMIPENEM 1 SENSITIVE Sensitive     NITROFURANTOIN 64 INTERMEDIATE Intermediate     TRIMETH/SULFA <=20 SENSITIVE Sensitive     PIP/TAZO <=4 SENSITIVE Sensitive     * 10,000 COLONIES/mL ENTEROBACTER AEROGENES   Proteus mirabilis - MIC*    AMPICILLIN >=32 RESISTANT Resistant     CEFAZOLIN <=4 SENSITIVE Sensitive     CEFTRIAXONE <=1 SENSITIVE Sensitive     CIPROFLOXACIN >=4 RESISTANT Resistant     GENTAMICIN <=1 SENSITIVE Sensitive     IMIPENEM 2 SENSITIVE Sensitive     NITROFURANTOIN 128 RESISTANT Resistant     TRIMETH/SULFA >=320 RESISTANT Resistant     AMPICILLIN/SULBACTAM <=2  SENSITIVE Sensitive     PIP/TAZO <=4 SENSITIVE Sensitive     * 30,000 COLONIES/mL PROTEUS MIRABILIS    Assessment: 83 year old female with severe right hydronephrosis secondary to 1.5 cm right UPJ stone, sepsis of urinary source post procedure day 3 status post right percutaneous nephrostomy tube placement.  Micro-growing Enterobacter sensitive to fluoroquinolones. Nephrostomy tube culture also growing Proteus.  Plan: - Agree with plan for Cipro 10 days - Routine nephrostomy tube care -Definitive management of the stone to be further determined as outpatient once infection has resolved, appointment has been made. -Urology will sign off, please page with any questions or concerns.     LOS: 3 days    Hollice Espy 01/13/2017

## 2017-01-13 NOTE — Progress Notes (Signed)
*  PRELIMINARY RESULTS* Echocardiogram 2D Echocardiogram has been performed.  Megan Hamilton 01/13/2017, 3:52 PM

## 2017-01-13 NOTE — Care Management Important Message (Signed)
Important Message  Patient Details  Name: Megan Hamilton MRN: [id number] Date of Birth: 1933/01/02   Medicare Important Message Given:  Yes    Shelbie Ammons, RN 01/13/2017, 9:50 AM

## 2017-01-13 NOTE — Progress Notes (Signed)
[facility] [place] NAME: Megan Hamilton    MR#:  [id number]  DATE OF BIRTH:  [birth year]  SUBJECTIVE:  Patient seen earlier this am was planning to d/c however noted to be in new onset a fib  REVIEW OF SYSTEMS:    Review of Systems  Constitutional: Negative for fever, chills weight loss HENT: Negative for ear pain, nosebleeds, congestion, facial swelling, rhinorrhea, neck pain, neck stiffness and ear discharge.   Respiratory: Negative for cough, shortness of breath, wheezing  Cardiovascular: Negative for chest pain, palpitations and leg swelling.  Gastrointestinal: Negative for heartburn, abdominal pain, vomiting, diarrhea or consitpation Genitourinary: Negative for dysuria, urgency, frequency, hematuria Musculoskeletal: Negative for back pain or joint pain Neurological: Negative for dizziness, seizures, syncope, focal weakness,  numbness and headaches.  Hematological: Does not bruise/bleed easily.  Psychiatric/Behavioral: Negative for hallucinations, confusion, dysphoric mood    Tolerating Diet: yes      DRUG ALLERGIES:   Allergies  Allergen Reactions  . Cephalexin Hives  . Quinine Derivatives     VITALS:  Blood pressure (!) 145/85, pulse 83, temperature 98.4 F (36.9 C), temperature source Oral, resp. rate 18, height 5\' 6"  (1.676 m), weight 88.9 kg (196 lb), SpO2 97 %.  PHYSICAL EXAMINATION:  Constitutional: Appears well-developed and well-nourished. No distress. HENT: Normocephalic. Marland Kitchen Oropharynx is clear and moist.  Eyes: Conjunctivae and EOM are normal. PERRLA, no scleral icterus.  Neck: Normal ROM. Neck supple. No JVD. No tracheal deviation. CVS:irr, irr tachy+, no murmurs, no gallops, no carotid bruit.  Pulmonary: Effort and breath sounds normal, no stridor, rhonchi, wheezes, rales.  Abdominal: Soft. BS +,  no distension, tenderness, rebound or guarding.  nephrostomy tube  colostomy Musculoskeletal: Normal range of  motion. No edema and no tenderness.  Neuro: Alert. CN 2-12 grossly intact. No focal deficits. Skin: Skin is warm and dry. No rash noted. Psychiatric: Normal mood and affect.      LABORATORY PANEL:   CBC  Recent Labs Lab 01/11/17 0030  WBC 13.6*  HGB 11.9*  HCT 35.4  PLT 100*   ------------------------------------------------------------------------------------------------------------------  Chemistries   Recent Labs Lab 01/10/17 1648 01/11/17 0030  NA 136 135  K 3.9 3.9  CL 101 104  CO2 27 23  GLUCOSE 170* 171*  BUN 28* 28*  CREATININE 1.17* 1.07*  CALCIUM 8.8* 8.0*  AST 45*  --   ALT 35  --   ALKPHOS 48  --   BILITOT 1.7*  --    ------------------------------------------------------------------------------------------------------------------  Cardiac Enzymes  Recent Labs Lab 01/10/17 1701  TROPONINI 0.03*   ------------------------------------------------------------------------------------------------------------------  RADIOLOGY:  Ct Angio Chest Pe W Or Wo Contrast  Result Date: 01/13/2017 CLINICAL DATA:  Shortness of breath. EXAM: CT ANGIOGRAPHY CHEST WITH CONTRAST TECHNIQUE: Multidetector CT imaging of the chest was performed using the standard protocol during bolus administration of intravenous contrast. Multiplanar CT image reconstructions and MIPs were obtained to evaluate the vascular anatomy. CONTRAST:  75 mL of Isovue 370 intravenously. COMPARISON:  None. FINDINGS: Cardiovascular: There is no evidence of large central pulmonary embolus. Smaller peripheral pulmonary emboli cannot be excluded due to respiratory motion artifact. Mediastinum/Nodes: No enlarged mediastinal, hilar, or axillary lymph nodes. Thyroid gland, trachea, and esophagus demonstrate no significant findings. Lungs/Pleura: No pneumothorax or pleural effusion is noted. Mild subsegmental atelectasis is noted posteriorly in the right upper lobe. 6 mm subpleural nodule is seen in right  middle lobe best seen on image number 49 of series 6. 8 mm  subpleural nodule is seen in left upper lobe laterally best seen on image number 35 of series 8. Upper Abdomen: No acute abnormality. Musculoskeletal: No chest wall abnormality. No acute or significant osseous findings. Review of the MIP images confirms the above findings. IMPRESSION: No evidence of large central pulmonary embolus. Smaller peripheral pulmonary emboli cannot be excluded on the basis of this exam due to respiratory motion artifact. Mild subsegmental atelectasis is noted posteriorly in the right upper lobe. 6 mm subpleural nodule seen in right middle lobe with 8 mm subpleural nodule seen in left upper lobe. Non-contrast chest CT at 3-6 months is recommended. If the nodules are stable at time of repeat CT, then future CT at 18-24 months (from today's scan) is considered optional for low-risk patients, but is recommended for high-risk patients. This recommendation follows the consensus statement: Guidelines for Management of Incidental Pulmonary Nodules Detected on CT Images: From the Fleischner Society 2017; Radiology 2017; 284:228-243. Electronically Signed   By: Marijo Conception, M.D.   On: 01/13/2017 13:20     ASSESSMENT AND PLAN:   83 year old female with history of colorectal cancer status post colostomy who presented with altered mental status and found to have sepsis due to acute pyelonephritis   1. Sepsis with leukocytosis, tachycardia, elevated lactic acid Sepsis is due to pyelonephritis and ENTEROBACTER AEROGENES bacteremia This is sensitive to fluoroquinolones. Patient will be discharged with ciprofloxacin for 10 more days. Urine culture also contain less than 30,000 Proteus Patient is allergic to cephalosporins which causes hives  2. Acute pyelonephritis with bacteremia: Patient will continue ciprofloxacin as mentioned above 3. Right ureteral stone status post nephrostomy Routine nephrostomy tube care Patient  will have follow-up with urology for removal of stone within the next 2 weeks  4. Hyperlipidemia: Continue statin  5. History CVA: Continue aspirin and statin 6history of colorectal cancer status post colostomy 7. Acute hypoxic Resp Failure with tachycardia: Ct scan was negative for PE but did show atelectasis. She will need ISS and wean O2.   8. New onset A FIb: She has been evaluated by Cardiology this evening Follow up on ECHO Continue Metoprolol for HR control Cardiology discussed anticoagulation with daughter. At this time [facility] is deferred in light of possibility that this is reversible after current infection resolves. She will have outpatient Cardiology follow up after discharge. Her sinus tachycardia was not due to PE.SHe was started on low dose Metoprolol for HR control.  8.6 mm subpleural nodule seen in right middle lobe with 8 mm subpleural nodule seen in left upper lobe. Non-contrast chest CT at 3-6 months is recommended       Management plans discussed with the patient and daughter and she is in agreement.  CODE STATUS: full  TOTAL TIME TAKING CARE OF THIS PATIENT: 28 minutes.     POSSIBLE D/C tomorrow, DEPENDING ON CLINICAL CONDITION.   Modell Fendrick M.D on 01/13/2017 at 8:40 PM  Between 7am to 6pm - Pager - [phone] After 6pm go to [email or web address] - password [email or web address] [facility]  Office  [phone]  CC: Primary care physician; Verita Lamb, NP  Note: This dictation was prepared with Dragon dictation along with smaller phrase technology. Any transcriptional errors that result from this process are unintentional.

## 2017-01-13 NOTE — Clinical Social Work Placement (Signed)
   CLINICAL SOCIAL WORK PLACEMENT  NOTE  Date:  01/13/2017  Patient Details  Name: Megan Hamilton MRN: [id number] Date of Birth: 1933-01-11  Clinical Social Work is seeking post-discharge placement for this patient at the [place] level of care (*CSW will initial, date and re-position this form in  chart as items are completed):  Yes   Patient/family provided with [place] Work Department's list of facilities offering this level of care within the geographic area requested by the patient (or if unable, by the patient's family).  Yes   Patient/family informed of their freedom to choose among providers that offer the needed level of care, that participate in Medicare, Medicaid or managed care program needed by the patient, have an available bed and are willing to accept the patient.  Yes   Patient/family informed of [facility]'s ownership interest in [facility] and [facility], as well as of the fact that they are under no obligation to receive care at these facilities.  PASRR submitted to EDS on       PASRR number received on       Existing PASRR number confirmed on 01/12/17     FL2 transmitted to all facilities in geographic area requested by pt/family on 01/12/17     FL2 transmitted to all facilities within larger geographic area on       Patient informed that his/her managed care company has contracts with or will negotiate with certain facilities, including the following:        Yes   Patient/family informed of bed offers received.  Patient chooses bed at [facility]     Physician recommends and patient chooses bed at      Patient to be transferred to [facility] on 01/13/17.  Patient to be transferred to facility by [facility]     Patient family notified on 01/13/17 of transfer.  Name of family member notified:  Pt's daughter, [name]     PHYSICIAN       Additional Comment:     _______________________________________________ Darden Dates, LCSW 01/13/2017, 2:09 PM

## 2017-01-14 MED ORDER — METOPROLOL TARTRATE 25 MG PO TABS: 25.0000 mg | ORAL_TABLET | Freq: Two times a day (BID) | ORAL | 0 refills | Status: AC

## 2017-01-14 MED ORDER — FUROSEMIDE 10 MG/ML IJ SOLN: 20.0000 mg | Freq: Once | INTRAMUSCULAR | Status: DC

## 2017-01-14 MED ORDER — METOPROLOL TARTRATE 25 MG PO TABS
25.0000 mg | ORAL_TABLET | Freq: Two times a day (BID) | ORAL | Status: DC
  Administered 2017-01-14: 08:00:00 25 mg via ORAL
  Filled 2017-01-14: qty 1

## 2017-01-14 NOTE — Progress Notes (Signed)
Physical Therapy Treatment Patient Details Name: Megan Hamilton MRN: [id number] DOB: 04-02-1933 Today's Date: 01/14/2017    History of Present Illness Pt brought to [facility] by family due to AMS, nausea, and vomiting. She was daignosed with sepsis secondary to acute pyelonephritis and is now s/p R percutaneous nephrostomy secondary to acute R nephrolithiasis. Pt is still confused at time of PT evaluation and history is provided by daughter.    PT Comments    Pt in bed.  Participated in exercises as described below.  Bed mobility with mod a x 2.  Poor sitting balance leans to right, needs min a to remain upright with verbal cues.  Pt was able to stand x 2 with mod a x 2 to stand and min a x 2 once up for short periods limited by fatigue.  Did not attempt stepping due to safety and fatigue.  While standing she was incontinent of a large amt of urine.  Pt reports it is typical for her to loose control when standing and has been ongoing for a while prior to admit.  Pt returned to supine with mod a x 2 and required mod a x 1 while rolling for complete bed change with nursing tech.  Pt remains globally weak and SNF remain appropriate for discharge disposition.   Follow Up Recommendations  SNF;Other (comment)     Equipment Recommendations  None recommended by PT    Recommendations for Other Services       Precautions / Restrictions Precautions Precautions: Fall Precaution Comments: nephrostomy tube R side Restrictions Weight Bearing Restrictions: No    Mobility  Bed Mobility Overal bed mobility: Needs Assistance Bed Mobility: Supine to Sit;Sit to Supine;Rolling Rolling: Mod assist   Supine to sit: Mod assist;+2 for physical assistance Sit to supine: Mod assist;+2 for physical assistance      Transfers Overall transfer level: Needs assistance Equipment used: Rolling walker (2 wheeled) Transfers: Sit to/from Stand Sit to Stand: Mod assist;+2 physical assistance;Min assist          General transfer comment: stood x 2 with verabl and tactile cues to stand fully.  mod a x 2 to stand, min a x 2 once standing, leans to right.  unable to step.  Ambulation/Gait             General Gait Details: Unable/unsafe to attempt at this time   Stairs            Wheelchair Mobility    Modified Rankin (Stroke Patients Only)       Balance Overall balance assessment: Needs assistance Sitting-balance support: Bilateral upper extremity supported;Feet supported Sitting balance-Leahy Scale: Poor     Standing balance support: Bilateral upper extremity supported Standing balance-Leahy Scale: Poor Standing balance comment: Pt requires constant minA+2 to remain upright due to heavy leaning to the R and falling back onto bed                            Cognition Arousal/Alertness: Awake/alert Behavior During Therapy: WFL for tasks assessed/performed Overall Cognitive Status: Within Functional Limits for tasks assessed                                 General Comments: Pt orientated to place, time, situation.  Appropriate conversation.      Exercises Other Exercises Other Exercises: supine AAROM x 10 BLE ankle pumps, SLR, heel slides,  ab/add    General Comments        Pertinent Vitals/Pain Pain Assessment: No/denies pain    Home Living                      Prior Function            PT Goals (current goals can now be found in the care plan section) Progress towards PT goals: Progressing toward goals    Frequency    Min 2X/week      PT Plan Current plan remains appropriate    Co-evaluation              AM-PAC PT "6 Clicks" Daily Activity  Outcome Measure  Difficulty turning over in bed (including adjusting bedclothes, sheets and blankets)?: Total Difficulty moving from lying on back to sitting on the side of the bed? : Total Difficulty sitting down on and standing up from a chair with arms (e.g.,  wheelchair, bedside commode, etc,.)?: Total Help needed moving to and from a bed to chair (including a wheelchair)?: A Lot Help needed walking in hospital room?: Total Help needed climbing 3-5 steps with a railing? : Total 6 Click Score: 7    End of Session Equipment Utilized During Treatment: Gait belt Activity Tolerance: No increased pain;Patient tolerated treatment well Patient left: in bed;with bed alarm set;with call bell/phone within reach;with nursing/sitter in room         Time: 0939-1002 PT Time Calculation (min) (ACUTE ONLY): 23 min  Charges:  $Therapeutic Exercise: 8-22 mins $Therapeutic Activity: 8-22 mins                    G Codes:       Chesley Noon, PTA 01/14/17, 10:45 AM

## 2017-01-14 NOTE — Progress Notes (Signed)
[facility]  SUBJECTIVE: Patient denies chest pain, palpitations, or shortness of breath.   Vitals:   01/13/17 1941 01/14/17 0401 01/14/17 0824 01/14/17 0836  BP: (!) 145/85 121/73 (!) 143/88   Pulse: 83 (!) 119 (!) 112   Resp: 18 18 16    Temp: 98.4 F (36.9 C) 98.6 F (37 C) 98.4 F (36.9 C)   TempSrc: Oral Oral Oral   SpO2: 97% 97% 96% 96%  Weight:      Height:         Intake/Output Summary (Last 24 hours) at 01/14/17 0853 Last data filed at 01/14/17 0550  Gross per 24 hour  Intake                5 ml  Output              250 ml  Net             -245 ml      PHYSICAL EXAM  General: In no acute distress HEENT:  Normocephalic and atramatic Neck:  No JVD.  Lungs: Clear bilaterally to auscultation  Heart: Irregularly irregular Abdomen: Bowel sounds are positive, abdomen soft and non-tender  Msk:  Back normal, sitting upright in bed Extremities: Trace bilateral lower extremity edema.   Neuro: Alert and oriented X 3. Psych:  Good affect, responds appropriately   LABS: Basic Metabolic Panel: No results for input(s): NA, K, CL, CO2, GLUCOSE, BUN, CREATININE, CALCIUM, MG, PHOS in the last 72 hours. Liver Function Tests: No results for input(s): AST, ALT, ALKPHOS, BILITOT, PROT, ALBUMIN in the last 72 hours. No results for input(s): LIPASE, AMYLASE in the last 72 hours. CBC: No results for input(s): WBC, NEUTROABS, HGB, HCT, MCV, PLT in the last 72 hours. Cardiac Enzymes: No results for input(s): CKTOTAL, CKMB, CKMBINDEX, TROPONINI in the last 72 hours. BNP: Invalid input(s): POCBNP D-Dimer: No results for input(s): DDIMER in the last 72 hours. Hemoglobin A1C: No results for input(s): HGBA1C in the last 72 hours. Fasting Lipid Panel: No results for input(s): CHOL, HDL, LDLCALC, TRIG, CHOLHDL, LDLDIRECT in the last 72 hours. Thyroid Function Tests: No results for input(s): TSH, T4TOTAL, T3FREE, THYROIDAB in the last 72 hours.  Invalid input(s):  FREET3 Anemia Panel: No results for input(s): VITAMINB12, FOLATE, FERRITIN, TIBC, IRON, RETICCTPCT in the last 72 hours.  Ct Angio Chest Pe W Or Wo Contrast  Result Date: 01/13/2017 CLINICAL DATA:  Shortness of breath. EXAM: CT ANGIOGRAPHY CHEST WITH CONTRAST TECHNIQUE: Multidetector CT imaging of the chest was performed using the standard protocol during bolus administration of intravenous contrast. Multiplanar CT image reconstructions and MIPs were obtained to evaluate the vascular anatomy. CONTRAST:  75 mL of Isovue 370 intravenously. COMPARISON:  None. FINDINGS: Cardiovascular: There is no evidence of large central pulmonary embolus. Smaller peripheral pulmonary emboli cannot be excluded due to respiratory motion artifact. Mediastinum/Nodes: No enlarged mediastinal, hilar, or axillary lymph nodes. Thyroid gland, trachea, and esophagus demonstrate no significant findings. Lungs/Pleura: No pneumothorax or pleural effusion is noted. Mild subsegmental atelectasis is noted posteriorly in the right upper lobe. 6 mm subpleural nodule is seen in right middle lobe best seen on image number 49 of series 6. 8 mm subpleural nodule is seen in left upper lobe laterally best seen on image number 35 of series 8. Upper Abdomen: No acute abnormality. Musculoskeletal: No chest wall abnormality. No acute or significant osseous findings. Review of the MIP images confirms the above findings. IMPRESSION: No evidence of large central pulmonary embolus.  Smaller peripheral pulmonary emboli cannot be excluded on the basis of this exam due to respiratory motion artifact. Mild subsegmental atelectasis is noted posteriorly in the right upper lobe. 6 mm subpleural nodule seen in right middle lobe with 8 mm subpleural nodule seen in left upper lobe. Non-contrast chest CT at 3-6 months is recommended. If the nodules are stable at time of repeat CT, then future CT at 18-24 months (from today's scan) is considered optional for low-risk  patients, but is recommended for high-risk patients. This recommendation follows the consensus statement: Guidelines for Management of Incidental Pulmonary Nodules Detected on CT Images: From the Fleischner Society 2017; Radiology 2017; 284:228-243. Electronically Signed   By: Marijo Conception, M.D.   On: 01/13/2017 13:20     Echo EF 40-45%, mild MR  TELEMETRY: Atrial fibrillation, rate 126 bpm  ASSESSMENT AND PLAN:  Active Problems:   Pyelonephritis    1. New onset atrial fibrillation in the setting of sepsis due to pyelonephritis, with a chads vasc score of 5, currently on aspirin, rate appropriately compensatory, metoprolol increased to 25 mg BID 2. Sepsis due to pyelonephritis, status post percutaneous nephrostomy 3. History of CVA of uncertain etiology  Recommendations: 1. Continue current therapy 2. Continue metoprolol tartrate for rate control for now, and aspirin 3. Discussed in detail with patient's daughter the risks, benefits, and alternatives of chronic anticoagulation with warfarin versus a novel anticoagulant. At this time, it was agreed upon to defer full dose anticoagulation in light of possible reversibility of atrial fibrillation once pyelonephritis resolves, her easy bleeding history, and possible upcoming invasive procedure for management of kidney stone, but also taking into consideration her history of strokes of uncertain etiology and increased risk of recurrence. We will discuss anticoagulation further as outpatient. 4. Recommend follow-up as outpatient with Dr. Saralyn Pilar in 1 week   Sign off for now; call with any questions.   Clabe Seal, PA-C 01/14/2017 8:53 AM

## 2017-01-14 NOTE — Progress Notes (Signed)
Called report to [employer] at [facility]

## 2017-01-14 NOTE — Clinical Social Work Note (Signed)
Pt is ready for discharge today and will go to [facility]. Pt's daughter is aware and agreeable to discharge plan. Facility is ready to accept pt as discharge information has been received. RN will call report. [facility] will provide transportation. CSW is signing off as no further needs identified.   Darden Dates, MSW, LCSW  Clinical Social Worker  [phone]

## 2017-01-14 NOTE — Clinical Social Work Placement (Signed)
   CLINICAL SOCIAL WORK PLACEMENT  NOTE  Date:  01/14/2017  Patient Details  Name: Megan Hamilton MRN: [id number] Date of Birth: September 18, 1932  Clinical Social Work is seeking post-discharge placement for this patient at the [place] level of care (*CSW will initial, date and re-position this form in  chart as items are completed):  Yes   Patient/family provided with [facility] Work Department's list of facilities offering this level of care within the geographic area requested by the patient (or if unable, by the patient's family).  Yes   Patient/family informed of their freedom to choose among providers that offer the needed level of care, that participate in Medicare, Medicaid or managed care program needed by the patient, have an available bed and are willing to accept the patient.  Yes   Patient/family informed of [facility]'s ownership interest in [facility] and [facility], as well as of the fact that they are under no obligation to receive care at these facilities.  PASRR submitted to EDS on       PASRR number received on       Existing PASRR number confirmed on 01/12/17     FL2 transmitted to all facilities in geographic area requested by pt/family on 01/12/17     FL2 transmitted to all facilities within larger geographic area on       Patient informed that his/her managed care company has contracts with or will negotiate with certain facilities, including the following:        Yes   Patient/family informed of bed offers received.  Patient chooses bed at [facility]     Physician recommends and patient chooses bed at      Patient to be transferred to [facility] on 01/14/17.  Patient to be transferred to facility by [facility]     Patient family notified on 01/14/17 of transfer.  Name of family member notified:  Pt's daughter, [name]     PHYSICIAN       Additional Comment:     _______________________________________________ Darden Dates, LCSW 01/14/2017, 11:01 AM

## 2017-01-14 NOTE — Plan of Care (Signed)
Problem: Education: Goal: Knowledge of [facility] Education information/materials will improve Outcome: Progressing VSS, free of falls during shift.  No complaints overnight.  Pt asleep majority of shift.  Bed in low position, call bell within reach.  WCTM.

## 2017-01-28 ENCOUNTER — Ambulatory Visit (INDEPENDENT_AMBULATORY_CARE_PROVIDER_SITE_OTHER): Payer: Medicare Other | Admitting: Urology

## 2017-01-28 ENCOUNTER — Encounter: Payer: Self-pay | Admitting: Urology

## 2017-01-28 ENCOUNTER — Other Ambulatory Visit: Payer: Self-pay | Admitting: Radiology

## 2017-01-28 VITALS — BP 151/81 | HR 72 | Ht 66.0 in

## 2017-01-28 DIAGNOSIS — N201 Calculus of ureter: Secondary | ICD-10-CM

## 2017-01-28 NOTE — Progress Notes (Signed)
01/28/2017 1:40 PM   Megan Hamilton 10-Feb-1933 [id number]  Referring provider: Verita Lamb, NP [address] [place], [place] [address]  Chief Complaint  Patient presents with  . New Patient (Initial Visit)    Ureteral Stone    HPI: The patient is an 82 year old female who recently had a right percutaneous nephrostomy tube placed for 1-1/2 cm right UPJ stone in the setting of sepsis.  The patient is feeling much better at this time. This is her first kidney stone. She is back to her usual baseline health status. She reports her nephrostomy tube is draining well.  Her urine culture grew Proteus and Enterobacter. These bacteria were resistant to all oral medications that she is not allergic to.  PMH: Past Medical History:  Diagnosis Date  . Cancer [facility])    colorectal 2017  . Colorectal cancer ([place])   . Stroke [facility])     Surgical History: Past Surgical History:  Procedure Laterality Date  . ABDOMINAL HYSTERECTOMY    . CHOLECYSTECTOMY    . COLON SURGERY    . IR NEPHROSTOMY PLACEMENT RIGHT  01/10/2017    Home Medications:  Allergies as of 01/28/2017      Reactions   Cephalexin Hives   Quinine Derivatives       Medication List       Accurate as of 01/28/17  1:40 PM. Always use your most recent med list.          acetaminophen 500 MG tablet Commonly known as:  TYLENOL Take 1,000 mg by mouth every 6 (six) hours as needed for fever.   aspirin EC 81 MG tablet Take 81 mg by mouth daily.   atorvastatin 80 MG tablet Commonly known as:  LIPITOR Take 80 mg by mouth daily.   diphenhydramine-acetaminophen 25-500 MG Tabs tablet Commonly known as:  TYLENOL PM Take 1 tablet by mouth at bedtime.   ferrous sulfate 325 (65 FE) MG tablet Take 325 mg by mouth daily with breakfast.   loratadine 10 MG tablet Commonly known as:  CLARITIN Take 10 mg by mouth daily.   metoprolol tartrate 25 MG tablet Commonly known as:  LOPRESSOR Take 1 tablet (25 mg total) by mouth 2  (two) times daily.   multivitamin tablet Take 1 tablet by mouth daily.   vitamin C with rose hips 500 MG tablet Take 500 mg by mouth daily.       Allergies:  Allergies  Allergen Reactions  . Cephalexin Hives  . Quinine Derivatives     Family History: Family History  Problem Relation Age of Onset  . Diabetes Mother   . Stroke Father   . Bladder Cancer Neg Hx   . Kidney cancer Neg Hx     Social History:  reports that she has never smoked. She has never used smokeless tobacco. She reports that she does not drink alcohol or use drugs.  ROS: UROLOGY Frequent Urination?: No Hard to postpone urination?: No Burning/pain with urination?: No Get up at night to urinate?: No Leakage of urine?: No Urine stream starts and stops?: No Trouble starting stream?: No Do you have to strain to urinate?: No Blood in urine?: No Urinary tract infection?: No Sexually transmitted disease?: No Injury to kidneys or bladder?: No Painful intercourse?: No Weak stream?: No Currently pregnant?: No Vaginal bleeding?: No Last menstrual period?: n  Gastrointestinal Nausea?: No Vomiting?: No Indigestion/heartburn?: No Diarrhea?: No Constipation?: No  Constitutional Fever: No Night sweats?: No Weight loss?: No Fatigue?: No  Skin  Skin rash/lesions?: No Itching?: No  Eyes Blurred vision?: No Double vision?: No  Ears/Nose/Throat Sore throat?: No Sinus problems?: Yes  Hematologic/Lymphatic Swollen glands?: No Easy bruising?: Yes  Cardiovascular Leg swelling?: No Chest pain?: No  Respiratory Cough?: No Shortness of breath?: No  Endocrine Excessive thirst?: No  Musculoskeletal Back pain?: No Joint pain?: No  Neurological Headaches?: No Dizziness?: No  Psychologic Depression?: No Anxiety?: No  Physical Exam: BP (!) 151/81 (BP Location: Left Arm, Patient Position: Sitting, Cuff Size: Large)   Pulse 72   Ht 5\' 6"  (1.676 m)   Constitutional:  Alert and  oriented, No acute distress. HEENT: Megan Hamilton AT, moist mucus membranes.  Trachea midline, no masses. Cardiovascular: No clubbing, cyanosis, or edema. Respiratory: Normal respiratory effort, no increased work of breathing. GI: Abdomen is soft, nontender, nondistended, no abdominal masses GU: No CVA tenderness. Right percutaneous nephrostomy tube draining clear yellow urine. Skin: No rashes, bruises or suspicious lesions. Lymph: No cervical or inguinal adenopathy. Neurologic: Grossly intact, no focal deficits, moving all 4 extremities. Psychiatric: Normal mood and affect.  Laboratory Data: Lab Results  Component Value Date   WBC 13.6 (H) 01/11/2017   HGB 11.9 (L) 01/11/2017   HCT 35.4 01/11/2017   MCV 91.6 01/11/2017   PLT 100 (L) 01/11/2017    Lab Results  Component Value Date   CREATININE 1.07 (H) 01/11/2017    No results found for: PSA  No results found for: TESTOSTERONE  No results found for: HGBA1C  Urinalysis    Component Value Date/Time   COLORURINE AMBER (A) 01/10/2017 1648   APPEARANCEUR CLOUDY (A) 01/10/2017 1648   LABSPEC 1.019 01/10/2017 1648   PHURINE 6.0 01/10/2017 1648   GLUCOSEU 50 (A) 01/10/2017 1648   HGBUR SMALL (A) 01/10/2017 1648   BILIRUBINUR NEGATIVE 01/10/2017 1648   KETONESUR NEGATIVE 01/10/2017 1648   PROTEINUR 100 (A) 01/10/2017 1648   NITRITE POSITIVE (A) 01/10/2017 1648   LEUKOCYTESUR LARGE (A) 01/10/2017 1648    Assessment & Plan:    1. Right 1.5 cm UPJ stone s/p right PCN placement I discussed treatment options the patient and her daughter for her stone. We discussed going straight to cystoscopy with ureteroscopy versus having her right nephrostomy tube internalized into an antegrade ureteral stent then proceeding with ureteroscopy. I am very concerned that her stone appears to be embedded at the right UPJ that if I am unable to place a wire during ureteroscopy that we would have to cancel her surgery and then proceed with antegrade stent  placement followed by repeat ureteroscopy. I'm also concerned that all oral antibiotics that the patient is not allergic to are ineffective against a recent culture. If he went straight to right ureteroscopy she would need preoperative antibiotics for a few days due to colonization from her nephrostomy tube. Internalized in the stent wouldn't mitigate that need. As such, we would have the patient's right nephrostomy tube internalized to a right ureteral stent by interventional radiology. We'll check a urine culture a few days after that. We'll plan to proceed with cystoscopy, ureteroscopy, laser lithotripsy, right ureteral stent exchange a few weeks after internalization to allow passive dilation of the ureter. All risks, benefits, indications of procedure were discussed in great detail the patient. She had not understand the risks include but are not limited to infection, bleeding, iatrogenic injury, need for repeat procedures, and having a right ureteral stent replacement in the procedure. All questions were answered. The patient and her daughter have elected to proceed.  Nickie Retort, MD  [facility] [address], [place], [place] [address] [phone]

## 2017-02-02 ENCOUNTER — Telehealth: Payer: Self-pay | Admitting: Radiology

## 2017-02-02 NOTE — Telephone Encounter (Signed)
Notified daughter, [name], of right nephrostomy tube exchange for right ureteral stent scheduled 02/05/17 with arrival to [facility] registration desk at 10:30. Advised pt to be npo after mn except to take metoprolol with a sip of water. Daughter voices understanding. Daughter states pt is currently a resident at [facility] but she plans to bring the pt home on 02/04/17.

## 2017-02-04 ENCOUNTER — Other Ambulatory Visit: Payer: Self-pay | Admitting: Radiology

## 2017-02-05 ENCOUNTER — Ambulatory Visit (HOSPITAL_COMMUNITY)
Admission: RE | Admit: 2017-02-05 | Discharge: 2017-02-05 | Disposition: A | Discharge status: Home / Self Care | Payer: Medicare Other | Source: Ambulatory Visit | Attending: Urology | Admitting: Urology

## 2017-02-05 DIAGNOSIS — Z8673 Personal history of transient ischemic attack (TIA), and cerebral infarction without residual deficits: Secondary | ICD-10-CM | POA: Diagnosis not present

## 2017-02-05 DIAGNOSIS — I10 Essential (primary) hypertension: Secondary | ICD-10-CM | POA: Diagnosis not present

## 2017-02-05 DIAGNOSIS — N201 Calculus of ureter: Secondary | ICD-10-CM

## 2017-02-05 DIAGNOSIS — N202 Calculus of kidney with calculus of ureter: Secondary | ICD-10-CM | POA: Insufficient documentation

## 2017-02-05 DIAGNOSIS — Z7982 Long term (current) use of aspirin: Secondary | ICD-10-CM | POA: Insufficient documentation

## 2017-02-05 HISTORY — DX: Personal history of urinary calculi: Z87.442

## 2017-02-05 HISTORY — PX: IR CONVERT RIGHT NEPHROSTOMY TO NEPHROURETERAL CATH: IMG6068

## 2017-02-05 HISTORY — DX: Essential (primary) hypertension: I10

## 2017-02-05 LAB — CBC
HEMATOCRIT: 35.2 % (ref 35.0–47.0)
Hemoglobin: 11.9 g/dL — ABNORMAL LOW (ref 12.0–16.0)
MCH: 29.4 pg (ref 26.0–34.0)
MCHC: 33.7 g/dL (ref 32.0–36.0)
MCV: 87.4 fL (ref 80.0–100.0)
PLATELETS: 424 10*3/uL (ref 150–440)
RBC: 4.03 MIL/uL (ref 3.80–5.20)
RDW: 15.1 % — AB (ref 11.5–14.5)
WBC: 8.6 10*3/uL (ref 3.6–11.0)

## 2017-02-05 LAB — APTT: aPTT: 30 seconds (ref 24–36)

## 2017-02-05 LAB — PROTIME-INR
INR: 0.95
Prothrombin Time: 12.7 seconds (ref 11.4–15.2)

## 2017-02-05 MED ORDER — CIPROFLOXACIN IN D5W 400 MG/200ML IV SOLN
INTRAVENOUS | Status: AC
  Filled 2017-02-05: qty 200

## 2017-02-05 MED ORDER — SODIUM CHLORIDE 0.9 % IV SOLN
INTRAVENOUS | Status: DC
  Administered 2017-02-05: 11:00:00 via INTRAVENOUS

## 2017-02-05 MED ORDER — FENTANYL CITRATE (PF) 100 MCG/2ML IJ SOLN
INTRAMUSCULAR | Status: AC | PRN
  Administered 2017-02-05: 25 ug via INTRAVENOUS

## 2017-02-05 MED ORDER — MIDAZOLAM HCL 5 MG/5ML IJ SOLN
INTRAMUSCULAR | Status: AC
  Filled 2017-02-05: qty 5

## 2017-02-05 MED ORDER — IOPAMIDOL (ISOVUE-300) INJECTION 61%: 10.0000 mL | Freq: Once | INTRAVENOUS | Status: DC | PRN

## 2017-02-05 MED ORDER — CIPROFLOXACIN IN D5W 400 MG/200ML IV SOLN
400.0000 mg | Freq: Once | INTRAVENOUS | Status: AC
  Administered 2017-02-05: 400 mg via INTRAVENOUS

## 2017-02-05 MED ORDER — FENTANYL CITRATE (PF) 100 MCG/2ML IJ SOLN
INTRAMUSCULAR | Status: AC
  Filled 2017-02-05: qty 4

## 2017-02-05 MED ORDER — SODIUM CHLORIDE FLUSH 0.9 % IV SOLN
INTRAVENOUS | Status: AC
  Filled 2017-02-05: qty 20

## 2017-02-05 MED ORDER — MIDAZOLAM HCL 5 MG/5ML IJ SOLN
INTRAMUSCULAR | Status: AC | PRN
  Administered 2017-02-05: 1 mg via INTRAVENOUS

## 2017-02-05 NOTE — Procedures (Signed)
  Pre-operative Diagnosis: Right renal pelvic stone      Post-operative Diagnosis: Right renal pelvic stone   Indications:  Needs conversion of nephrostomy to ureteral stent  Procedure: Successful placement of 22 cm, 8 Fr ureteral stent.  Nephrostomy removed.   Findings: Renal pelvic stone.  Ureter stent is patent, proximal aspect of stent in lower pole.    Complications: None     EBL: Minimal  Plan: Discharge to home.

## 2017-02-05 NOTE — H&P (Signed)
Chief Complaint: Patient was seen in consultation today for right ureteral stent at the request of Nickie Retort  Referring Physician(s): Nickie Retort  Supervising Physician: Markus Daft  Patient Status: [facility] - Out-pt  History of Present Illness: Megan Hamilton is a 83 y.o. female who underwent placement of (R)PCN on 5/8 for an obstructing right UPJ stone. She has done pretty well with the PCN and has completed a course of op abx. She is now scheduled for attempt at internalization of the PCN to ureteral stent. Chart, imaging, meds, labs, allergies reviewed Daughter is at bedside. She has been  NPO this am.  Past Medical History:  Diagnosis Date  . Cancer [facility])    colorectal 2017  . Colorectal cancer ([place])   . History of kidney stones   . Hypertension   . Stroke [facility])     Past Surgical History:  Procedure Laterality Date  . ABDOMINAL HYSTERECTOMY    . CHOLECYSTECTOMY    . COLON SURGERY    . COLOSTOMY  2017  . IR NEPHROSTOMY PLACEMENT RIGHT  01/10/2017    Allergies: Cephalexin and Quinine derivatives  Medications: Prior to Admission medications   Medication Sig Start Date End Date Taking? Authorizing Provider  acetaminophen (TYLENOL) 500 MG tablet Take 1,000 mg by mouth every 6 (six) hours as needed for fever.   Yes [provider]  Ascorbic Acid (VITAMIN C WITH ROSE HIPS) 500 MG tablet Take 500 mg by mouth daily.   Yes [provider]  aspirin EC 81 MG tablet Take 81 mg by mouth daily.   Yes [provider]  atorvastatin (LIPITOR) 80 MG tablet Take 80 mg by mouth daily.   Yes [provider]  ferrous sulfate 325 (65 FE) MG tablet Take 325 mg by mouth daily with breakfast.   Yes [provider]  loratadine (CLARITIN) 10 MG tablet Take 10 mg by mouth daily.   Yes [provider]  metoprolol tartrate (LOPRESSOR) 25 MG tablet Take 1 tablet (25 mg total) by mouth 2 (two) times daily. 01/14/17  Yes Bettey Costa, MD  Multiple Vitamin (MULTIVITAMIN) tablet Take 1 tablet by mouth daily.   Yes [provider]  diphenhydramine-acetaminophen (TYLENOL PM) 25-500 MG TABS tablet Take 1 tablet by mouth at bedtime.    [provider]     Family History  Problem Relation Age of Onset  . Diabetes Mother   . Stroke Father   . Bladder Cancer Neg Hx   . Kidney cancer Neg Hx     Social History   Social History  . Marital status: Widowed    Spouse name: N/A  . Number of children: N/A  . Years of education: N/A   Social History Main Topics  . Smoking status: Never Smoker  . Smokeless tobacco: Never Used  . Alcohol use No  . Drug use: No  . Sexual activity: Not Asked   Other Topics Concern  . None   Social History Narrative  . None     Review of Systems: A 12 point ROS discussed and pertinent positives are indicated in the HPI above.  All other systems are negative.  Review of Systems  Vital Signs: BP (!) 162/78 (BP Location: Left Leg)   Pulse 73   Temp 98.2 F (36.8 C) (Oral)   Resp 15   Wt 200 lb (90.7 kg)   SpO2 98%   BMI 32.28 kg/m   Physical Exam  Constitutional: She is oriented  to person, place, and time. She appears well-developed and well-nourished. No distress.  HENT:  Head: Normocephalic.  Mouth/Throat: Oropharynx is clear and moist.  Neck: Normal range of motion. No JVD present. No tracheal deviation present.  Cardiovascular: Normal rate, regular rhythm and normal heart sounds.   Pulmonary/Chest: Effort normal and breath sounds normal. No respiratory distress.  Abdominal: Soft. She exhibits no distension. There is no tenderness.  (R)flank PCN intact, site clean. Clear UOP in bag  Neurological: She is alert and oriented to person, place, and time.  Skin: Skin is warm and dry.  Psychiatric: She has a normal mood and affect. Judgment normal.    Mallampati Score:  MD Evaluation Airway: WNL Heart: WNL Abdomen: WNL Chest/ Lungs: WNL ASA   Classification: 3 Mallampati/Airway Score: One  Imaging: Ir Nephrostomy Placement Right  Result Date: 01/12/2017 CLINICAL DATA:  Sepsis and obstructing calculus at the right ureteropelvic junction. EXAM: 1. ULTRASOUND GUIDANCE FOR PUNCTURE OF THE RIGHT RENAL COLLECTING SYSTEM. 2. RIGHT PERCUTANEOUS NEPHROSTOMY TUBE PLACEMENT. COMPARISON:  CT of the abdomen and pelvis on 01/10/2017 ANESTHESIA/SEDATION: 0.5 mg IV Versed; 25 mcg IV Fentanyl. Total Moderate Sedation Time 30 minutes. The patient's level of consciousness and physiologic status were continuously monitored during the procedure by Radiology nursing. CONTRAST:  15 ml Isovue 300 MEDICATIONS: No additional medications administered. FLUOROSCOPY TIME:  1 minutes and 6 seconds.  39mG y. PROCEDURE: The procedure, risks, benefits, and alternatives were explained to the patient's son and daughter. Questions regarding the procedure were encouraged and answered. The patient's son understands and consents to the procedure. The right flank region was prepped with Betadine in a sterile fashion, and a sterile drape was applied covering the operative field. A sterile gown and sterile gloves were used for the procedure. Local anesthesia was provided with 1% Lidocaine. Ultrasound was used to localize the right kidney. Under direct ultrasound guidance, a 21 gauge needle was advanced into the renal collecting system. Ultrasound image documentation was performed. Aspiration of urine sample was performed followed by contrast injection. A transitional dilator was advanced over a guidewire. Percutaneous tract dilatation was then performed over the guidewire. A 10 -French percutaneous nephrostomy tube was then advanced and formed in the collecting system. Catheter position was confirmed by fluoroscopy after contrast injection. The catheter was secured at the skin with a Prolene retention suture and Stat-Lock device. A gravity bag was placed. COMPLICATIONS: None. FINDINGS: After  puncture of the right renal collecting system from a lower pole approach, aspiration yielded grossly purulent urine. A sample was sent for culture analysis. The nephrostomy tube was formed in the renal pelvis. IMPRESSION: Placement of 10 French percutaneous nephrostomy tube on the right which was formed at the level of the renal pelvis. There was return of grossly purulent urine consistent with pyonephrosis. A sample was sent for culture analysis. Electronically Signed   By: Aletta Edouard M.D.   On: 01/12/2017 08:13    Labs:  CBC:  Recent Labs  01/10/17 1648 01/11/17 0030 02/05/17 1035  WBC 16.8* 13.6* 8.6  HGB 13.7 11.9* 11.9*  HCT 41.2 35.4 35.2  PLT 140* 100* 424    COAGS:  Recent Labs  01/10/17 1648 02/05/17 1035  INR 1.17 0.95  APTT  --  30    BMP:  Recent Labs  01/10/17 1648 01/11/17 0030  NA 136 135  K 3.9 3.9  CL 101 104  CO2 27 23  GLUCOSE 170* 171*  BUN 28* 28*  CALCIUM 8.8* 8.0*  CREATININE 1.17*  1.07*  GFRNONAA 43* 47*  GFRAA 49* 55*    LIVER FUNCTION TESTS:  Recent Labs  01/10/17 1648  BILITOT 1.7*  AST 45*  ALT 35  ALKPHOS 48  PROT 7.2  ALBUMIN 3.5    TUMOR MARKERS: No results for input(s): AFPTM, CEA, CA199, CHROMGRNA in the last 8760 hours.  Assessment and Plan: Obstructing right UPJ stone S/p (R)PCN 5/8 Plan for attempted internalization to stent and hopeful removal of PCN. Explained to pt and daughter if difficulty or concern for stent failure, may need to leave PCN in place but capped. Risks and Benefits discussed with the patient including, but not limited to infection, bleeding, significant bleeding causing loss or decrease in renal function or damage to adjacent structures.  All of the patient's questions were answered, patient is agreeable to proceed. Consent signed and in chart.    Thank you for this interesting consult.  I greatly enjoyed meeting Kida Digiulio O'Briant and look forward to participating in their care.  A  copy of this report was sent to the requesting provider on this date.  Electronically Signed: Ascencion Dike, PA-C 02/05/2017, 11:36 AM   I spent a total of 20 in face to face in clinical consultation, greater than 50% of which was counseling/coordinating care for PCN conversion to ureteral stent

## 2017-02-09 ENCOUNTER — Other Ambulatory Visit: Payer: Self-pay | Admitting: Radiology

## 2017-02-09 DIAGNOSIS — N201 Calculus of ureter: Secondary | ICD-10-CM

## 2017-02-09 NOTE — Telephone Encounter (Signed)
Notified pt's daughter, [name], of ureteroscopy with laser lithotripsy scheduled with Dr Pilar Jarvis on 02/26/17, RTC for cath specimen ucx on 02/12/17 @11 :00,  pre-admit testing appt on 02/12/17 @11 :45 & to call day prior to surgery for arrival time to SDS. Advised pt to continue ASA 81mg  per Dr Pilar Jarvis. Questions were answered to daughter's satisfaction. No further questions at this time. Daughter voices understanding.

## 2017-02-12 ENCOUNTER — Ambulatory Visit (INDEPENDENT_AMBULATORY_CARE_PROVIDER_SITE_OTHER): Payer: Medicare Other

## 2017-02-12 ENCOUNTER — Encounter
Admission: RE | Admit: 2017-02-12 | Discharge: 2017-02-12 | Disposition: A | Discharge status: Home / Self Care | Payer: Medicare Other | Source: Ambulatory Visit | Attending: Urology | Admitting: Urology

## 2017-02-12 VITALS — BP 124/77 | HR 68 | Ht 66.0 in

## 2017-02-12 DIAGNOSIS — N201 Calculus of ureter: Secondary | ICD-10-CM

## 2017-02-12 DIAGNOSIS — I10 Essential (primary) hypertension: Secondary | ICD-10-CM | POA: Insufficient documentation

## 2017-02-12 DIAGNOSIS — Z8673 Personal history of transient ischemic attack (TIA), and cerebral infarction without residual deficits: Secondary | ICD-10-CM | POA: Insufficient documentation

## 2017-02-12 DIAGNOSIS — C19 Malignant neoplasm of rectosigmoid junction: Secondary | ICD-10-CM | POA: Insufficient documentation

## 2017-02-12 DIAGNOSIS — Z01818 Encounter for other preprocedural examination: Secondary | ICD-10-CM | POA: Insufficient documentation

## 2017-02-12 NOTE — Progress Notes (Addendum)
In and Out Catheterization  Patient is present today for a I & O catheterization due to need of cath specimen for culture prior to surgery. UTI symptoms: hard to postpone urination, leakage of urine, blood in urine, nausea and vomiting. Patient was cleaned and prepped in a sterile fashion with betadine and Lidocaine 2% jelly was instilled into the urethra.  A 16FR cath was inserted no complications were noted , 169ml of urine return was noted, urine was cloudy in color. A clean urine sample was collected for culture.    Performed by: C.Corinna Capra, CMA and Lyndee Hensen, [place]

## 2017-02-12 NOTE — Patient Instructions (Signed)
  Your procedure is scheduled CW:CBJSEG February 26, 2017. Report to Same Day Surgery. To find out your arrival time please call [phone] between 1PM - 3PM on Thursday February 25, 2017.  Remember: Instructions that are not followed completely may result in serious medical risk, up to and including death, or upon the discretion of your surgeon and anesthesiologist your surgery may need to be rescheduled.    _x___ 1. Do not eat food or drink liquids after midnight. No gum chewing or hard candies.     ____ 2. No Alcohol for 24 hours before or after surgery.   ____ 3. Bring all medications with you on the day of surgery if instructed.    __x__ 4. Notify your doctor if there is any change in your medical condition     (cold, fever, infections).    _____ 5. No smoking 24 hours prior to surgery.     Do not wear jewelry, make-up, hairpins, clips or nail polish.  Do not wear lotions, powders, or perfumes.   Do not shave 48 hours prior to surgery. Men may shave face and neck.  Do not bring valuables to the hospital.    [facility] is not responsible for any belongings or valuables.               Contacts, dentures or bridgework may not be worn into surgery.  Leave your suitcase in the car. After surgery it may be brought to your room.  For patients admitted to the hospital, discharge time is determined by your treatment team.   Patients discharged the day of surgery will not be allowed to drive home.    Please read over the following fact sheets that you were given:   [facility] Preparing for Surgery  _x___ Take these medicines the morning of surgery with A SIP OF WATER:    1. Metoprolol   ____ Fleet Enema (as directed)   ____ Use CHG Soap as directed on instruction sheet  ____ Use inhalers on the day of surgery and bring to hospital day of surgery  ____ Stop metformin 2 days prior to surgery    ____ Take 1/2 of usual insulin dose the night before surgery and none on the morning of   surgery.   ____ Stop Coumadin/Plavix/aspirin on does not apply.  _x___ Stop Anti-inflammatories such as Advil, Aleve, Ibuprofen, Motrin, Naproxen, Naprosyn, Goodies powders or aspirin products. OK to take Tylenol.   ____ Stop supplements: Vitamins E and C until after surgery.    ____ Bring C-Pap to the hospital.

## 2017-02-13 ENCOUNTER — Other Ambulatory Visit: Payer: Self-pay | Admitting: Urology

## 2017-02-13 MED ORDER — CIPROFLOXACIN HCL 500 MG PO TABS: 500.0000 mg | ORAL_TABLET | Freq: Two times a day (BID) | ORAL | 0 refills | Status: DC

## 2017-02-13 NOTE — Progress Notes (Signed)
Cipro sent to pharmacy pending urine culture results. Previous urine culture sensitive to cipro.

## 2017-02-17 LAB — URINE CULTURE

## 2017-02-25 MED ORDER — GENTAMICIN SULFATE 40 MG/ML IJ SOLN
120.0000 mg | INTRAMUSCULAR | Status: AC
  Administered 2017-02-26: 120 mg via INTRAVENOUS
  Filled 2017-02-25: qty 3

## 2017-02-25 MED ORDER — CIPROFLOXACIN IN D5W 400 MG/200ML IV SOLN
400.0000 mg | INTRAVENOUS | Status: AC
  Administered 2017-02-26: 400 mg via INTRAVENOUS

## 2017-02-26 ENCOUNTER — Ambulatory Visit: Payer: Medicare Other | Admitting: Anesthesiology

## 2017-02-26 ENCOUNTER — Telehealth: Payer: Self-pay | Admitting: Urology

## 2017-02-26 ENCOUNTER — Encounter: Payer: Self-pay | Admitting: *Deleted

## 2017-02-26 ENCOUNTER — Ambulatory Visit
Admission: RE | Admit: 2017-02-26 | Discharge: 2017-02-26 | Disposition: A | Discharge status: Home / Self Care | Payer: Medicare Other | Source: Ambulatory Visit | Attending: Urology | Admitting: Urology

## 2017-02-26 ENCOUNTER — Encounter
Admission: RE | Disposition: A | Discharge status: Home / Self Care | Payer: Self-pay | Source: Ambulatory Visit | Attending: Urology

## 2017-02-26 DIAGNOSIS — Z87442 Personal history of urinary calculi: Secondary | ICD-10-CM | POA: Insufficient documentation

## 2017-02-26 DIAGNOSIS — I10 Essential (primary) hypertension: Secondary | ICD-10-CM | POA: Diagnosis not present

## 2017-02-26 DIAGNOSIS — Z8673 Personal history of transient ischemic attack (TIA), and cerebral infarction without residual deficits: Secondary | ICD-10-CM | POA: Insufficient documentation

## 2017-02-26 DIAGNOSIS — Z7982 Long term (current) use of aspirin: Secondary | ICD-10-CM | POA: Insufficient documentation

## 2017-02-26 DIAGNOSIS — Q625 Duplication of ureter: Secondary | ICD-10-CM | POA: Insufficient documentation

## 2017-02-26 DIAGNOSIS — Z85528 Personal history of other malignant neoplasm of kidney: Secondary | ICD-10-CM | POA: Diagnosis not present

## 2017-02-26 DIAGNOSIS — Z79899 Other long term (current) drug therapy: Secondary | ICD-10-CM | POA: Diagnosis not present

## 2017-02-26 DIAGNOSIS — N201 Calculus of ureter: Secondary | ICD-10-CM | POA: Insufficient documentation

## 2017-02-26 DIAGNOSIS — N200 Calculus of kidney: Secondary | ICD-10-CM

## 2017-02-26 HISTORY — PX: CYSTOSCOPY W/ URETERAL STENT PLACEMENT: SHX1429

## 2017-02-26 HISTORY — PX: URETEROSCOPY WITH HOLMIUM LASER LITHOTRIPSY: SHX6645

## 2017-02-26 SURGERY — URETEROSCOPY, WITH LITHOTRIPSY USING HOLMIUM LASER
Anesthesia: General | Laterality: Right

## 2017-02-26 MED ORDER — ROCURONIUM BROMIDE 50 MG/5ML IV SOLN
INTRAVENOUS | Status: AC
  Filled 2017-02-26: qty 1

## 2017-02-26 MED ORDER — ROCURONIUM BROMIDE 100 MG/10ML IV SOLN
INTRAVENOUS | Status: DC | PRN
  Administered 2017-02-26: 30 mg via INTRAVENOUS
  Administered 2017-02-26 (×2): 10 mg via INTRAVENOUS

## 2017-02-26 MED ORDER — SODIUM CHLORIDE 0.9 % IJ SOLN
INTRAMUSCULAR | Status: AC
Start: 2017-02-26 — End: 2017-02-26
  Filled 2017-02-26: qty 20

## 2017-02-26 MED ORDER — FENTANYL CITRATE (PF) 100 MCG/2ML IJ SOLN
INTRAMUSCULAR | Status: AC
  Filled 2017-02-26: qty 2

## 2017-02-26 MED ORDER — EPHEDRINE SULFATE 50 MG/ML IJ SOLN
INTRAMUSCULAR | Status: AC
  Filled 2017-02-26: qty 1

## 2017-02-26 MED ORDER — LACTATED RINGERS IV SOLN
INTRAVENOUS | Status: DC
  Administered 2017-02-26 (×2): via INTRAVENOUS

## 2017-02-26 MED ORDER — HYDROCODONE-ACETAMINOPHEN 5-325 MG PO TABS: 1.0000 | ORAL_TABLET | ORAL | 0 refills | Status: DC | PRN

## 2017-02-26 MED ORDER — CIPROFLOXACIN HCL 500 MG PO TABS: 500.0000 mg | ORAL_TABLET | Freq: Two times a day (BID) | ORAL | 0 refills | Status: DC

## 2017-02-26 MED ORDER — CIPROFLOXACIN IN D5W 400 MG/200ML IV SOLN
INTRAVENOUS | Status: AC
  Filled 2017-02-26: qty 200

## 2017-02-26 MED ORDER — EPHEDRINE SULFATE 50 MG/ML IJ SOLN
INTRAMUSCULAR | Status: DC | PRN
  Administered 2017-02-26: 5 mg via INTRAVENOUS
  Administered 2017-02-26 (×2): 10 mg via INTRAVENOUS

## 2017-02-26 MED ORDER — LIDOCAINE HCL (PF) 2 % IJ SOLN
INTRAMUSCULAR | Status: AC
  Filled 2017-02-26: qty 2

## 2017-02-26 MED ORDER — ACETAMINOPHEN 10 MG/ML IV SOLN
INTRAVENOUS | Status: AC
  Filled 2017-02-26: qty 100

## 2017-02-26 MED ORDER — ACETAMINOPHEN 10 MG/ML IV SOLN
INTRAVENOUS | Status: DC | PRN
  Administered 2017-02-26: 1000 mg via INTRAVENOUS

## 2017-02-26 MED ORDER — FENTANYL CITRATE (PF) 100 MCG/2ML IJ SOLN
INTRAMUSCULAR | Status: DC | PRN
  Administered 2017-02-26: 50 ug via INTRAVENOUS

## 2017-02-26 MED ORDER — IOTHALAMATE MEGLUMINE 43 % IV SOLN
INTRAVENOUS | Status: DC | PRN
  Administered 2017-02-26: 50 mL

## 2017-02-26 MED ORDER — PHENYLEPHRINE HCL 10 MG/ML IJ SOLN
INTRAMUSCULAR | Status: AC
  Filled 2017-02-26: qty 1

## 2017-02-26 MED ORDER — FAMOTIDINE 20 MG PO TABS
20.0000 mg | ORAL_TABLET | Freq: Once | ORAL | Status: AC
  Administered 2017-02-26: 20 mg via ORAL

## 2017-02-26 MED ORDER — DEXAMETHASONE SODIUM PHOSPHATE 10 MG/ML IJ SOLN
INTRAMUSCULAR | Status: DC | PRN
  Administered 2017-02-26: 5 mg via INTRAVENOUS

## 2017-02-26 MED ORDER — PROPOFOL 10 MG/ML IV BOLUS
INTRAVENOUS | Status: DC | PRN
  Administered 2017-02-26: 150 mg via INTRAVENOUS

## 2017-02-26 MED ORDER — ONDANSETRON HCL 4 MG/2ML IJ SOLN: 4.0000 mg | Freq: Once | INTRAMUSCULAR | Status: DC | PRN

## 2017-02-26 MED ORDER — LIDOCAINE HCL (CARDIAC) 20 MG/ML IV SOLN
INTRAVENOUS | Status: DC | PRN
  Administered 2017-02-26: 100 mg via INTRAVENOUS

## 2017-02-26 MED ORDER — ONDANSETRON HCL 4 MG/2ML IJ SOLN
INTRAMUSCULAR | Status: DC | PRN
  Administered 2017-02-26: 4 mg via INTRAVENOUS

## 2017-02-26 MED ORDER — FAMOTIDINE 20 MG PO TABS
ORAL_TABLET | ORAL | Status: AC
  Filled 2017-02-26: qty 1

## 2017-02-26 MED ORDER — SUGAMMADEX SODIUM 200 MG/2ML IV SOLN
INTRAVENOUS | Status: DC | PRN
  Administered 2017-02-26: 200 mg via INTRAVENOUS

## 2017-02-26 MED ORDER — FENTANYL CITRATE (PF) 100 MCG/2ML IJ SOLN: 25.0000 ug | INTRAMUSCULAR | Status: DC | PRN

## 2017-02-26 SURGICAL SUPPLY — 28 items
BACTOSHIELD CHG 4% 4OZ (MISCELLANEOUS) ×2
BASKET ZERO TIP 1.9FR (BASKET) ×3 IMPLANT
CATH URETL 5X70 OPEN END (CATHETERS) ×3 IMPLANT
CNTNR SPEC 2.5X3XGRAD LEK (MISCELLANEOUS) ×2
CONT SPEC 4OZ STER OR WHT (MISCELLANEOUS) ×4
CONTAINER SPEC 2.5X3XGRAD LEK (MISCELLANEOUS) ×2 IMPLANT
FIBER LASER LITHO 273 (Laser) ×3 IMPLANT
GLOVE BIO SURGEON STRL SZ7 (GLOVE) ×3 IMPLANT
GLOVE BIO SURGEON STRL SZ7.5 (GLOVE) ×3 IMPLANT
GOWN STRL REUS W/ TWL LRG LVL4 (GOWN DISPOSABLE) ×1 IMPLANT
GOWN STRL REUS W/TWL LRG LVL4 (GOWN DISPOSABLE) ×2
GOWN STRL REUS W/TWL XL LVL3 (GOWN DISPOSABLE) ×3 IMPLANT
GUIDEWIRE SUPER STIFF (WIRE) IMPLANT
INTRODUCER DILATOR DOUBLE (INTRODUCER) ×3 IMPLANT
KIT RM TURNOVER CYSTO AR (KITS) ×3 IMPLANT
PACK CYSTO AR (MISCELLANEOUS) ×3 IMPLANT
SCRUB CHG 4% DYNA-HEX 4OZ (MISCELLANEOUS) ×1 IMPLANT
SENSORWIRE 0.038 NOT ANGLED (WIRE) ×3
SET CYSTO W/LG BORE CLAMP LF (SET/KITS/TRAYS/PACK) ×3 IMPLANT
SHEATH URETERAL 13/15X36 1L (SHEATH) ×3 IMPLANT
SOL .9 NS 3000ML IRR  AL (IV SOLUTION) ×2
SOL .9 NS 3000ML IRR UROMATIC (IV SOLUTION) ×1 IMPLANT
STENT URET 6FRX24 CONTOUR (STENTS) IMPLANT
STENT URET 6FRX26 CONTOUR (STENTS) ×3 IMPLANT
SURGILUBE 2OZ TUBE FLIPTOP (MISCELLANEOUS) ×3 IMPLANT
SYRINGE IRR TOOMEY STRL 70CC (SYRINGE) ×3 IMPLANT
WATER STERILE IRR 1000ML POUR (IV SOLUTION) ×3 IMPLANT
WIRE SENSOR 0.038 NOT ANGLED (WIRE) ×1 IMPLANT

## 2017-02-26 NOTE — Telephone Encounter (Signed)
App made ° ° °Megan Hamilton  °

## 2017-02-26 NOTE — Interval H&P Note (Signed)
History and Physical Interval Note:  02/26/2017 9:14 AM  Megan Hamilton  has presented today for surgery, with the diagnosis of right upj stone  The various methods of treatment have been discussed with the patient and family. After consideration of risks, benefits and other options for treatment, the patient has consented to  Procedure(s): URETEROSCOPY WITH HOLMIUM LASER LITHOTRIPSY (Right) CYSTOSCOPY WITH STENT REPLACEMENT (Right) as a surgical intervention .  The patient's history has been reviewed, patient examined, no change in status, stable for surgery.  I have reviewed the patient's chart and labs.  Questions were answered to the patient's satisfaction.    Patient underwent right antegrade ureteral stent placement. Presents today for definitive stone management.  RRR Lungs clear  Nickie Retort

## 2017-02-26 NOTE — H&P (View-Only) (Signed)
Cipro sent to pharmacy pending urine culture results. Previous urine culture sensitive to cipro.

## 2017-02-26 NOTE — Transfer of Care (Signed)
Immediate Anesthesia Transfer of Care Note  Patient: Megan Hamilton  Procedure(s) Performed: Procedure(s): URETEROSCOPY WITH HOLMIUM LASER LITHOTRIPSY (Right) CYSTOSCOPY WITH STENT REPLACEMENT (Right)  Patient Location: PACU  Anesthesia Type:General  Level of Consciousness: awake, alert  and oriented  Airway & Oxygen Therapy: Patient Spontanous Breathing and Patient connected to face mask oxygen  Post-op Assessment: Report given to RN, Post -op Vital signs reviewed and stable and Patient moving all extremities  Post vital signs: Reviewed and stable  Last Vitals:  Vitals:   02/26/17 0842 02/26/17 1042  BP: (!) 143/71 (!) 143/71  Pulse: 61 67  Resp: 18 16  Temp: 36.8 C 36.9 C    Last Pain:  Vitals:   02/26/17 0842  TempSrc: Tympanic         Complications: No apparent anesthesia complications

## 2017-02-26 NOTE — Anesthesia Procedure Notes (Signed)
Procedure Name: Intubation Date/Time: 02/26/2017 9:45 AM Performed by: Tamala Julian, Cassie Shedlock Pre-anesthesia Checklist: Patient identified, Emergency Drugs available, Suction available, Patient being monitored and Timeout performed Patient Re-evaluated:Patient Re-evaluated prior to inductionOxygen Delivery Method: Circle system utilized Preoxygenation: Pre-oxygenation with 100% oxygen Intubation Type: IV induction Ventilation: Mask ventilation without difficulty Laryngoscope Size: Mac and 3 Grade View: Grade II Tube type: Oral Tube size: 7.0 mm Number of attempts: 1 Airway Equipment and Method: Stylet Placement Confirmation: breath sounds checked- equal and bilateral,  positive ETCO2 and ETT inserted through vocal cords under direct vision Secured at: 21 cm Tube secured with: Tape Dental Injury: Teeth and Oropharynx as per pre-operative assessment

## 2017-02-26 NOTE — Anesthesia Post-op Follow-up Note (Cosign Needed)
Anesthesia QCDR form completed.        

## 2017-02-26 NOTE — Progress Notes (Signed)
Voided x2  incontinent

## 2017-02-26 NOTE — Anesthesia Preprocedure Evaluation (Signed)
Anesthesia Evaluation  Patient identified by MRN, date of birth, ID band Patient awake    Reviewed: Allergy & Precautions, H&P , NPO status , Patient's Chart, lab work & pertinent test results, reviewed documented beta blocker date and time   History of Anesthesia Complications Negative for: history of anesthetic complications  Airway Mallampati: III  TM Distance: >3 FB Neck ROM: full    Dental  (+) Edentulous Upper, Upper Dentures, Partial Lower, Missing, Poor Dentition   Pulmonary neg pulmonary ROS,           Cardiovascular Exercise Tolerance: Good hypertension, (-) angina(-) CAD, (-) Past MI, (-) Cardiac Stents and (-) CABG (-) dysrhythmias (-) Valvular Problems/Murmurs     Neuro/Psych neg Seizures CVA, Residual Symptoms negative psych ROS   GI/Hepatic negative GI ROS, Neg liver ROS,   Endo/Other  negative endocrine ROS  Renal/GU Renal disease (kidney stone)  negative genitourinary   Musculoskeletal   Abdominal   Peds  Hematology negative hematology ROS (+)   Anesthesia Other Findings Past Medical History: No date: Cancer [facility])     Comment: colorectal 2017 No date: Colorectal cancer ([place]) No date: History of kidney stones No date: Hypertension 2017 x2 and 1993 x 1: Stroke ([place])     Comment: x 3   Reproductive/Obstetrics negative OB ROS                             Anesthesia Physical Anesthesia Plan  ASA: III  Anesthesia Plan: General   Post-op Pain Management:    Induction: Intravenous  PONV Risk Score and Plan: 2 and Ondansetron and Dexamethasone  Airway Management Planned: Oral ETT  Additional Equipment:   Intra-op Plan:   Post-operative Plan: Extubation in OR  Informed Consent: I have reviewed the patients History and Physical, chart, labs and discussed the procedure including the risks, benefits and alternatives for the proposed anesthesia with the patient or  authorized representative who has indicated his/her understanding and acceptance.   Dental Advisory Given  Plan Discussed with: Anesthesiologist, CRNA and Surgeon  Anesthesia Plan Comments:         Anesthesia Quick Evaluation

## 2017-02-26 NOTE — Telephone Encounter (Signed)
-----   Message from Nickie Retort, MD sent at 02/26/2017 10:42 AM EDT ----- Patient needs to see me next week for cysto/stent removal. thanks

## 2017-02-26 NOTE — Op Note (Signed)
Date of procedure: 02/26/17  Preoperative diagnosis:  1. Right proximal ureteral stone   Postoperative diagnosis:  1. Right proximal ureteral stone 2. 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 Cystoscopy 2. Right ureteroscopy 3. Laser lithotripsy 4. Stone basketing 5. Right retrograde pyelogram with interpretation 6. Right ureteral stent exchange 6 [place] by 26 cm  Surgeon: Baruch Gouty, MD  Anesthesia: General  Complications: None  Intraoperative findings: The patient had a very soft stone in the proximal ureter that was broken into small fragments and all large fragments were removed. On retrograde pyelogram, it did appear that the patient had a duplicated collecting system as the upper pole did not fill with contrast. The middle and lower pole were seen. I was unable to locate a second ureteral orifice within the bladder or a partial duplication in the ureter.  EBL: None  Specimens: Right ureteral stone to pathology  Drains: 6 French by 26 cm right double-J ureteral stent  Disposition: Stable to the postanesthesia care unit  Indication for procedure: The patient is a 83 y.o. female with history of sepsis from a 1.5 cm UPJ stone who underwent right nephrostomy tube placement followed by conversion to right antegrade stent when sepsis improved to presents today for definitive stone management.  After reviewing the management options for treatment, the patient elected to proceed with the above surgical procedure(s). We have discussed the potential benefits and risks of the procedure, side effects of the proposed treatment, the likelihood of the patient achieving the goals of the procedure, and any potential problems that might occur during the procedure or recuperation. Informed consent has been obtained.  Description of procedure: The patient was met in the preoperative area. All risks, benefits, and indications of the procedure were described in great  detail. The patient consented to the procedure. Preoperative antibiotics were given. The patient was taken to the operative theater. General anesthesia was induced per the anesthesia service. The patient was then placed in the dorsal lithotomy position and prepped and draped in the usual sterile fashion. A preoperative timeout was called.   A 21 French 30 cystoscope was inserted into the patient's bladder per urethra atraumatically. The right ureteral stent was grasped with flexible graspers and brought to the level of the urethral meatus. A sensor wire was exchanged through the right ureteral stent up to the right renal pelvis under fluoroscopy. The stent was removed. Semirigid ureteroscope was inserted in the patient's bladder and the right ureteral orifice. It was difficult to navigate up the proximal ureter so a second sensor wire was placed in the ureteroscope was withdrawn. Over one of the sensor wires a ureteral access sheath was placed atraumatically under fluoroscopy. A semirigid ureteroscope was then inserted into the ureteral access sheath and navigated up to the stone at the UPJ. The stone was then broken and the small fragment. It is very soft. These fragments were then removed. All fragments removed and sent to pathology. A right retrograde pyelogram at this time showed a classic drooping lily sign of a duplicated collecting system. There were no filling defects. I was unable to locate a partially a partially duplicated ureter or a second right ureteral orifice within the bladder based of the Weiggert-Meyer rule. However, the stone was a solitary stone was able to access, further attempts of this reported since it was not clinically relevant. At this point after removing the ureteral access sheath under direct visualization, the cystoscope was reassembled over the remaining sensor wire.  A 6 French by 26 cm double-J ureteral stent was placed in sensor wire was removed. A curl seen in the patient's  right renal pelvis with fluoroscopy and the urinary bladder under direct visualization.  Plan: The patient will follow-up in one week for right ureteral stent removal. She'll need a ultrasound in 1 month for iatrogenic injury. We'll go over her stone analysis results when available.   Baruch Gouty, M.D.

## 2017-02-26 NOTE — Anesthesia Postprocedure Evaluation (Signed)
Anesthesia Post Note  Patient: Megan Hamilton  Procedure(s) Performed: Procedure(s) (LRB): URETEROSCOPY WITH HOLMIUM LASER LITHOTRIPSY (Right) CYSTOSCOPY WITH STENT REPLACEMENT (Right)  Patient location during evaluation: PACU Anesthesia Type: General Level of consciousness: awake and alert Pain management: pain level controlled Vital Signs Assessment: post-procedure vital signs reviewed and stable Respiratory status: spontaneous breathing, nonlabored ventilation, respiratory function stable and patient connected to nasal cannula oxygen Cardiovascular status: blood pressure returned to baseline and stable Postop Assessment: no signs of nausea or vomiting Anesthetic complications: no     Last Vitals:  Vitals:   02/26/17 1110 02/26/17 1127  BP: 120/73 (!) 147/71  Pulse: 64 61  Resp: 14 14  Temp:      Last Pain:  Vitals:   02/26/17 1110  TempSrc:   PainSc: 0-No pain                 Martha Clan

## 2017-02-27 ENCOUNTER — Encounter: Payer: Self-pay | Admitting: Urology

## 2017-03-05 ENCOUNTER — Ambulatory Visit (INDEPENDENT_AMBULATORY_CARE_PROVIDER_SITE_OTHER): Payer: Medicare Other | Admitting: Urology

## 2017-03-05 VITALS — Ht 66.0 in | Wt 182.7 lb

## 2017-03-05 DIAGNOSIS — N201 Calculus of ureter: Secondary | ICD-10-CM | POA: Diagnosis not present

## 2017-03-05 MED ORDER — CIPROFLOXACIN HCL 500 MG PO TABS
500.0000 mg | ORAL_TABLET | Freq: Once | ORAL | Status: AC
  Administered 2017-03-05: 500 mg via ORAL

## 2017-03-05 MED ORDER — LIDOCAINE HCL 2 % EX GEL
1.0000 "application " | Freq: Once | CUTANEOUS | Status: AC
  Administered 2017-03-05: 1 via URETHRAL

## 2017-03-05 NOTE — Progress Notes (Signed)
   03/05/17  CC: No chief complaint on file.   HPI: The patient is an 83 year old female presents today for right ureteral stent removal after removing a 1.8 cm proximal right ureteral stone. She originally had nephrostomy tube placed in the setting of a Proteus and Enterobacter UTI. She   eventually underwent antegrade right ureteral stent placement followed by right ureteroscopy that was successful. She returns today for her stent removal. Her stone analysis is still pending. She did have an apparent duplicated right collecting sinus right retrograde polygrams did show an apparent drooping lily sign. It was unclear if it was partially duplicated or completely duplicated as no second ureteral orifice according to the Weigert-Meyer Rule or branch from a partial duplication was able to be located.  Height 5\' 6"  (1.676 m), weight 182 lb 11.2 oz (82.9 kg). NED. A&Ox3.   No respiratory distress   Abd soft, NT, ND Normal external genitalia with patent urethral meatus  Cystoscopy Procedure Note  Patient identification was confirmed, informed consent was obtained, and patient was prepped using Betadine solution.  Lidocaine jelly was administered per urethral meatus.    Preoperative abx where received prior to procedure.    Procedure: - Flexible cystoscope introduced, without any difficulty.   - Right ureteral stent removed per meatus intact with flex the graspers.  Post-Procedure: - Patient tolerated the procedure well  Assessment/ Plan:  1. History of nephrolithiasis The patient will need a follow-up in one month for renal ultrasound prior and iatrogenic hydronephrosis. We'll discuss her stone analysis results at that time.   Nickie Retort, MD

## 2017-03-08 LAB — STONE ANALYSIS
CA OXALATE, MONOHYDR.: 2 %
CA PHOS CRY STONE QL IR: 30 %
Magnesium Ammon Phos: 68 %
Stone Weight KSTONE: 29 mg

## 2017-03-31 ENCOUNTER — Encounter: Payer: Self-pay | Admitting: Emergency Medicine

## 2017-03-31 ENCOUNTER — Emergency Department: Payer: Medicare Other

## 2017-03-31 ENCOUNTER — Inpatient Hospital Stay
Admission: EM | Admit: 2017-03-31 | Discharge: 2017-04-02 | DRG: 872 | Disposition: A | Discharge status: Skilled Nursing Facility | Payer: Medicare Other | Attending: Internal Medicine | Admitting: Internal Medicine

## 2017-03-31 DIAGNOSIS — E785 Hyperlipidemia, unspecified: Secondary | ICD-10-CM | POA: Diagnosis not present

## 2017-03-31 DIAGNOSIS — Z87440 Personal history of urinary (tract) infections: Secondary | ICD-10-CM

## 2017-03-31 DIAGNOSIS — Z87442 Personal history of urinary calculi: Secondary | ICD-10-CM | POA: Diagnosis not present

## 2017-03-31 DIAGNOSIS — N390 Urinary tract infection, site not specified: Secondary | ICD-10-CM | POA: Diagnosis present

## 2017-03-31 DIAGNOSIS — Z9049 Acquired absence of other specified parts of digestive tract: Secondary | ICD-10-CM | POA: Diagnosis not present

## 2017-03-31 DIAGNOSIS — Z90710 Acquired absence of both cervix and uterus: Secondary | ICD-10-CM | POA: Diagnosis not present

## 2017-03-31 DIAGNOSIS — Z7982 Long term (current) use of aspirin: Secondary | ICD-10-CM | POA: Diagnosis not present

## 2017-03-31 DIAGNOSIS — E876 Hypokalemia: Secondary | ICD-10-CM | POA: Diagnosis present

## 2017-03-31 DIAGNOSIS — Z833 Family history of diabetes mellitus: Secondary | ICD-10-CM

## 2017-03-31 DIAGNOSIS — Z823 Family history of stroke: Secondary | ICD-10-CM

## 2017-03-31 DIAGNOSIS — Z881 Allergy status to other antibiotic agents status: Secondary | ICD-10-CM

## 2017-03-31 DIAGNOSIS — L8990 Pressure ulcer of unspecified site, unspecified stage: Secondary | ICD-10-CM | POA: Insufficient documentation

## 2017-03-31 DIAGNOSIS — Z8673 Personal history of transient ischemic attack (TIA), and cerebral infarction without residual deficits: Secondary | ICD-10-CM

## 2017-03-31 DIAGNOSIS — R4182 Altered mental status, unspecified: Secondary | ICD-10-CM | POA: Diagnosis present

## 2017-03-31 DIAGNOSIS — A419 Sepsis, unspecified organism: Secondary | ICD-10-CM | POA: Diagnosis present

## 2017-03-31 DIAGNOSIS — Z85048 Personal history of other malignant neoplasm of rectum, rectosigmoid junction, and anus: Secondary | ICD-10-CM | POA: Diagnosis not present

## 2017-03-31 DIAGNOSIS — Z933 Colostomy status: Secondary | ICD-10-CM | POA: Diagnosis not present

## 2017-03-31 DIAGNOSIS — N12 Tubulo-interstitial nephritis, not specified as acute or chronic: Secondary | ICD-10-CM | POA: Diagnosis present

## 2017-03-31 DIAGNOSIS — L89152 Pressure ulcer of sacral region, stage 2: Secondary | ICD-10-CM | POA: Diagnosis present

## 2017-03-31 DIAGNOSIS — I10 Essential (primary) hypertension: Secondary | ICD-10-CM | POA: Diagnosis not present

## 2017-03-31 DIAGNOSIS — Z888 Allergy status to other drugs, medicaments and biological substances status: Secondary | ICD-10-CM

## 2017-03-31 HISTORY — DX: Hyperlipidemia, unspecified: E78.5

## 2017-03-31 LAB — CBC
HEMATOCRIT: 35.9 % (ref 35.0–47.0)
Hemoglobin: 12.2 g/dL (ref 12.0–16.0)
MCH: 29.2 pg (ref 26.0–34.0)
MCHC: 34 g/dL (ref 32.0–36.0)
MCV: 86.1 fL (ref 80.0–100.0)
Platelets: 193 10*3/uL (ref 150–440)
RBC: 4.17 MIL/uL (ref 3.80–5.20)
RDW: 19.3 % — AB (ref 11.5–14.5)
WBC: 13.9 10*3/uL — AB (ref 3.6–11.0)

## 2017-03-31 LAB — COMPREHENSIVE METABOLIC PANEL
ALT: 17 U/L (ref 14–54)
ANION GAP: 10 (ref 5–15)
AST: 27 U/L (ref 15–41)
Albumin: 3.6 g/dL (ref 3.5–5.0)
Alkaline Phosphatase: 71 U/L (ref 38–126)
BILIRUBIN TOTAL: 1.6 mg/dL — AB (ref 0.3–1.2)
BUN: 29 mg/dL — AB (ref 6–20)
CHLORIDE: 102 mmol/L (ref 101–111)
CO2: 23 mmol/L (ref 22–32)
Calcium: 9 mg/dL (ref 8.9–10.3)
Creatinine, Ser: 1.13 mg/dL — ABNORMAL HIGH (ref 0.44–1.00)
GFR calc Af Amer: 51 mL/min — ABNORMAL LOW (ref >60)
GFR, EST NON AFRICAN AMERICAN: 44 mL/min — AB (ref >60)
Glucose, Bld: 132 mg/dL — ABNORMAL HIGH (ref 65–99)
POTASSIUM: 4.6 mmol/L (ref 3.5–5.1)
Sodium: 135 mmol/L (ref 135–145)
TOTAL PROTEIN: 7.7 g/dL (ref 6.5–8.1)

## 2017-03-31 LAB — URINALYSIS, COMPLETE (UACMP) WITH MICROSCOPIC
BILIRUBIN URINE: NEGATIVE
GLUCOSE, UA: NEGATIVE mg/dL
Ketones, ur: NEGATIVE mg/dL
NITRITE: NEGATIVE
PROTEIN: 100 mg/dL — AB
Specific Gravity, Urine: 1.018 (ref 1.005–1.030)
pH: 5 (ref 5.0–8.0)

## 2017-03-31 LAB — LACTIC ACID, PLASMA: Lactic Acid, Venous: 1.1 mmol/L (ref 0.5–1.9)

## 2017-03-31 MED ORDER — ACETAMINOPHEN 500 MG PO TABS
1000.0000 mg | ORAL_TABLET | Freq: Once | ORAL | Status: AC
  Administered 2017-03-31: 1000 mg via ORAL
  Filled 2017-03-31: qty 2

## 2017-03-31 MED ORDER — PIPERACILLIN-TAZOBACTAM 3.375 G IVPB 30 MIN
3.3750 g | Freq: Once | INTRAVENOUS | Status: AC
  Administered 2017-03-31: 3.375 g via INTRAVENOUS

## 2017-03-31 NOTE — ED Triage Notes (Signed)
Pt. Family reports increased confusion today.  Pt. Family state pt. Was treated for UTI about 2 months ago. Pt. Family report similar symptoms.

## 2017-03-31 NOTE — ED Provider Notes (Signed)
[facility] Emergency Department Provider Note  ____________________________________________  Time seen: Approximately 9:45 PM  I have reviewed the triage vital signs and the nursing notes.   HISTORY  Chief Complaint Altered Mental Status (Pt. son states pt. not acting herself today.)  Level 5 caveat:  Portions of the history and physical were unable to be obtained due to AMS   HPI Megan Hamilton is a 83 y.o. female with a history of colorectal cancer status post resection and ostomy currently in remission, recurrent UTIs, stroke, hypertension who presents for evaluation of altered mental status and fever. According to patient and patient's son she has been very confused since earlier today. Has had a low-grade fever at home. No cough or congestion, no sore throat, no neck stiffness or headache, no nausea or vomiting, no diarrhea, no abdominal pain, no chest pain or shortness of breath, no rash. They should reports that she is incontinent of urine and has no sensation of dysuria every time she has a UTI. According to the son patient usually gets this way when she has a urinary tract infection. No falls.  Past Medical History:  Diagnosis Date  . Cancer [facility])    colorectal 2017  . Colorectal cancer ([place])   . History of kidney stones   . HLD (hyperlipidemia)   . Hypertension   . Stroke ([place]) 2017 x2 and 1993 x 1   x 3    Patient Active Problem List   Diagnosis Date Noted  . HLD (hyperlipidemia) 03/31/2017  . HTN (hypertension) 03/31/2017  . UTI (urinary tract infection) 01/10/2017  . Kidney stone   . Sepsis [facility])     Past Surgical History:  Procedure Laterality Date  . ABDOMINAL HYSTERECTOMY    . CHOLECYSTECTOMY    . COLON SURGERY    . COLOSTOMY  2017  . CYSTOSCOPY W/ URETERAL STENT PLACEMENT Right 02/26/2017   Procedure: CYSTOSCOPY WITH STENT REPLACEMENT;  Surgeon: Nickie Retort, MD;  Location: [facility] ORS;  Service: Urology;  Laterality:  Right;  . IR CONVERT RIGHT NEPHROSTOMY TO NEPHROURETERAL CATH  02/05/2017  . IR NEPHROSTOMY PLACEMENT RIGHT  01/10/2017  . URETEROSCOPY WITH HOLMIUM LASER LITHOTRIPSY Right 02/26/2017   Procedure: URETEROSCOPY WITH HOLMIUM LASER LITHOTRIPSY;  Surgeon: Nickie Retort, MD;  Location: [facility] ORS;  Service: Urology;  Laterality: Right;    Prior to Admission medications   Medication Sig Start Date End Date Taking? Authorizing Provider  acetaminophen (TYLENOL) 500 MG tablet Take 1,000 mg by mouth every 6 (six) hours as needed for fever.    [provider]  Ascorbic Acid (VITAMIN C WITH ROSE HIPS) 500 MG tablet Take 500 mg by mouth 2 (two) times daily.     [provider]  aspirin EC 81 MG tablet Take 81 mg by mouth daily.    [provider]  atorvastatin (LIPITOR) 80 MG tablet Take 80 mg by mouth at bedtime.     [provider]  docusate sodium (COLACE) 250 MG capsule Take 250 mg by mouth daily.    [provider]  ferrous sulfate 325 (65 FE) MG tablet Take 325 mg by mouth daily with breakfast.    [provider]  HYDROcodone-acetaminophen (NORCO) 5-325 MG tablet Take 1 tablet by mouth every 4 (four) hours as needed for moderate pain. Patient not taking: Reported on 03/05/2017 02/26/17   Nickie Retort, MD  loratadine (CLARITIN) 10 MG tablet Take 10 mg by mouth daily.    [provider]  metoprolol tartrate (LOPRESSOR) 25 MG tablet Take 1 tablet (25 mg total) by mouth 2 (two) times daily. 01/14/17   Bettey Costa, MD  Multiple Vitamin (MULTIVITAMIN) tablet Take 1 tablet by mouth daily.    [provider]  naproxen sodium (ANAPROX) 220 MG tablet Take 440 mg by mouth 2 (two) times daily as needed (for pain).    [provider]  vitamin E 400 UNIT capsule Take 400 Units by mouth daily.    [provider]    Allergies Cephalexin and Quinine derivatives  Family History  Problem Relation Age of Onset  . Diabetes  Mother   . Stroke Father   . Bladder Cancer Neg Hx   . Kidney cancer Neg Hx     Social History Social History  Substance Use Topics  . Smoking status: Never Smoker  . Smokeless tobacco: Never Used  . Alcohol use No    Review of Systems  Constitutional: + fever and confusion Eyes: Negative for visual changes. ENT: Negative for sore throat. Neck: No neck pain  Cardiovascular: Negative for chest pain. Respiratory: Negative for shortness of breath. Gastrointestinal: Negative for abdominal pain, vomiting or diarrhea. Genitourinary: Negative for dysuria. Musculoskeletal: Negative for back pain. Skin: Negative for rash. Neurological: Negative for headaches, weakness or numbness. Psych: No SI or HI  ____________________________________________   PHYSICAL EXAM:  VITAL SIGNS: ED Triage Vitals [03/31/17 2121]  Enc Vitals Group     BP (!) 141/66     Pulse Rate 81     Resp 18     Temp 100 F (37.8 C)     Temp Source Oral     SpO2 96 %     Weight 200 lb (90.7 kg)     Height 5\' 6"  (1.676 m)     Head Circumference      Peak Flow      Pain Score      Pain Loc      Pain Edu?      Excl. in [place]?     Constitutional: Alert and oriented x2, no apparent distress. HEENT:      Head: Normocephalic and atraumatic.         Eyes: Conjunctivae are normal. Sclera is non-icteric.       Mouth/Throat: Mucous membranes are dry.       Neck: Supple with no signs of meningismus. Cardiovascular: Regular rate and rhythm. No murmurs, gallops, or rubs. 2+ symmetrical distal pulses are present in all extremities. No JVD. Respiratory: Normal respiratory effort. Lungs are clear to auscultation bilaterally. No wheezes, crackles, or rhonchi.  Gastrointestinal: Soft, non tender, and non distended with positive bowel sounds. No rebound or guarding. Musculoskeletal: Nontender with normal range of motion in all extremities. No edema, cyanosis, or erythema of extremities. Neurologic: Normal speech and  language. Face is symmetric. Moving all extremities. No gross focal neurologic deficits are appreciated. Skin: Skin is warm, dry and intact. No rash noted. Psychiatric: Mood and affect are normal. Speech and behavior are normal.  ____________________________________________   LABS (all labs ordered are listed, but only abnormal results are displayed)  Labs Reviewed  COMPREHENSIVE METABOLIC PANEL - Abnormal; Notable for the following:       Result Value   Glucose, Bld 132 (*)    BUN 29 (*)    Creatinine, Ser 1.13 (*)    Total Bilirubin 1.6 (*)    GFR calc non Af Amer 44 (*)    GFR calc Af Wyvonnia Lora  51 (*)    All other components within normal limits  CBC - Abnormal; Notable for the following:    WBC 13.9 (*)    RDW 19.3 (*)    All other components within normal limits  URINALYSIS, COMPLETE (UACMP) WITH MICROSCOPIC - Abnormal; Notable for the following:    Color, Urine YELLOW (*)    APPearance CLOUDY (*)    Hgb urine dipstick LARGE (*)    Protein, ur 100 (*)    Leukocytes, UA LARGE (*)    Bacteria, UA RARE (*)    Squamous Epithelial / LPF 0-5 (*)    All other components within normal limits  CULTURE, BLOOD (ROUTINE X 2)  CULTURE, BLOOD (ROUTINE X 2)  URINE CULTURE  LACTIC ACID, PLASMA  LACTIC ACID, PLASMA  CBG MONITORING, ED   ____________________________________________  EKG  ED ECG REPORT I, Rudene Re, the attending physician, personally viewed and interpreted this ECG.  Normal sinus rhythm, rate of 75, normal intervals, left axis deviation, LVH, no ST elevations or depressions. T-wave inversion in lead 3.  ____________________________________________  RADIOLOGY  CXR: Negative  ____________________________________________   PROCEDURES  Procedure(s) performed: None Procedures Critical Care performed: yes  CRITICAL CARE Performed by: Rudene Re  ?  Total critical care time: 35 min  Critical care time was exclusive of separately billable  procedures and treating other patients.  Critical care was necessary to treat or prevent imminent or life-threatening deterioration.  Critical care was time spent personally by me on the following activities: development of treatment plan with patient and/or surrogate as well as nursing, discussions with consultants, evaluation of patient's response to treatment, examination of patient, obtaining history from patient or surrogate, ordering and performing treatments and interventions, ordering and review of laboratory studies, ordering and review of radiographic studies, pulse oximetry and re-evaluation of patient's condition.  ____________________________________________   INITIAL IMPRESSION / ASSESSMENT AND PLAN / ED COURSE  83 y.o. female with a history of colorectal cancer status post resection and ostomy currently in remission, recurrent UTIs, stroke, hypertension who presents for evaluation of altered mental status and fever x 1 day. Family concerned the patient has a UTI since she usually gets this way when she had them. Patient is alert and oriented 2. Patient has good insight and her confusion, she has a low-grade fever of 100 with otherwise normal vital signs. Physical exam shows no acute findings include clear lungs, soft abdomen with no tenderness, no rash, no meningeal signs. Patient does not meet sepsis criteria at this time. Will check labs, ua, lactic, and reassess     _________________________ 11:22 PM on 03/31/2017 ----------------------------------------- Patient now with a temperature of 101.3F, leukocytosis with white count of 13.9 and a UA consistent with urinary tract infection. Patient meets sepsis criteria. Patient was given Zosyn, IV fluids, and will be admitted to the hospitalist service.   Pertinent labs & imaging results that were available during my care of the patient were reviewed by me and considered in my medical decision making (see chart for  details).    ____________________________________________   FINAL CLINICAL IMPRESSION(S) / ED DIAGNOSES  Final diagnoses:  Sepsis, due to unspecified organism [facility])  Pyelonephritis      NEW MEDICATIONS STARTED DURING THIS VISIT:  New Prescriptions   No medications on file     Note:  This document was prepared using Dragon voice recognition software and may include unintentional dictation errors.    Rudene Re, MD 03/31/17 [phone]

## 2017-03-31 NOTE — H&P (Signed)
[place] at [place] NAME: Megan Hamilton    MR#:  [id number]  DATE OF BIRTH:  11-Oct-1932  DATE OF ADMISSION:  03/31/2017  PRIMARY CARE PHYSICIAN: Megan Lamb, NP   REQUESTING/REFERRING PHYSICIAN: Alfred Levins, MD  CHIEF COMPLAINT:   Chief Complaint  Patient presents with  . Altered Mental Status    Pt. son states pt. not acting herself today.    HISTORY OF PRESENT ILLNESS:  Megan Hamilton  is a 83 y.o. female who presents with Confusion, fever, dysuria. Patient was brought by family when they noticed her symptoms. Here she was found to meet sepsis criteria, and has a UTI. Hospitals were called for admission  PAST MEDICAL HISTORY:   Past Medical History:  Diagnosis Date  . Cancer [facility])    colorectal 2017  . Colorectal cancer ([place])   . History of kidney stones   . HLD (hyperlipidemia)   . Hypertension   . Stroke ([place]) 2017 x2 and 1993 x 1   x 3    PAST SURGICAL HISTORY:   Past Surgical History:  Procedure Laterality Date  . ABDOMINAL HYSTERECTOMY    . CHOLECYSTECTOMY    . COLON SURGERY    . COLOSTOMY  2017  . CYSTOSCOPY W/ URETERAL STENT PLACEMENT Right 02/26/2017   Procedure: CYSTOSCOPY WITH STENT REPLACEMENT;  Surgeon: Megan Retort, MD;  Location: [facility] ORS;  Service: Urology;  Laterality: Right;  . IR CONVERT RIGHT NEPHROSTOMY TO NEPHROURETERAL CATH  02/05/2017  . IR NEPHROSTOMY PLACEMENT RIGHT  01/10/2017  . URETEROSCOPY WITH HOLMIUM LASER LITHOTRIPSY Right 02/26/2017   Procedure: URETEROSCOPY WITH HOLMIUM LASER LITHOTRIPSY;  Surgeon: Megan Retort, MD;  Location: [facility] ORS;  Service: Urology;  Laterality: Right;    SOCIAL HISTORY:   Social History  Substance Use Topics  . Smoking status: Never Smoker  . Smokeless tobacco: Never Used  . Alcohol use No    FAMILY HISTORY:   Family History  Problem Relation Age of Onset  . Diabetes Mother   . Stroke Father   . Bladder Cancer Neg Hx   . Kidney  cancer Neg Hx     DRUG ALLERGIES:   Allergies  Allergen Reactions  . Cephalexin Hives  . Quinine Derivatives Nausea And Vomiting    MEDICATIONS AT HOME:   Prior to Admission medications   Medication Sig Start Date End Date Taking? Authorizing Provider  acetaminophen (TYLENOL) 500 MG tablet Take 1,000 mg by mouth every 6 (six) hours as needed for fever.    [provider]  Ascorbic Acid (VITAMIN C WITH ROSE HIPS) 500 MG tablet Take 500 mg by mouth 2 (two) times daily.     [provider]  aspirin EC 81 MG tablet Take 81 mg by mouth daily.    [provider]  atorvastatin (LIPITOR) 80 MG tablet Take 80 mg by mouth at bedtime.     [provider]  docusate sodium (COLACE) 250 MG capsule Take 250 mg by mouth daily.    [provider]  ferrous sulfate 325 (65 FE) MG tablet Take 325 mg by mouth daily with breakfast.    [provider]  loratadine (CLARITIN) 10 MG tablet Take 10 mg by mouth daily.    [provider]  metoprolol tartrate (LOPRESSOR) 25 MG tablet Take 1 tablet (25 mg total) by mouth 2 (two) times daily. 01/14/17   Megan Costa, MD  Multiple Vitamin (MULTIVITAMIN) tablet Take 1 tablet by mouth daily.  [provider]  naproxen sodium (ANAPROX) 220 MG tablet Take 440 mg by mouth 2 (two) times daily as needed (for pain).    [provider]  vitamin E 400 UNIT capsule Take 400 Units by mouth daily.    [provider]    REVIEW OF SYSTEMS:  Review of Systems  Constitutional: Positive for fever and malaise/fatigue. Negative for chills and weight loss.  HENT: Negative for ear pain, hearing loss and tinnitus.   Eyes: Negative for blurred vision, double vision, pain and redness.  Respiratory: Negative for cough, hemoptysis and shortness of breath.   Cardiovascular: Negative for chest pain, palpitations, orthopnea and leg swelling.  Gastrointestinal: Negative for abdominal pain, constipation,  diarrhea, nausea and vomiting.  Genitourinary: Positive for dysuria. Negative for frequency and hematuria.  Musculoskeletal: Negative for back pain, joint pain and neck pain.  Skin:       No acne, rash, or lesions  Neurological: Negative for dizziness, tremors, focal weakness and weakness.  Endo/Heme/Allergies: Negative for polydipsia. Does not bruise/bleed easily.  Psychiatric/Behavioral: Negative for depression. The patient is not nervous/anxious and does not have insomnia.      VITAL SIGNS:   Vitals:   03/31/17 2121 03/31/17 2200 03/31/17 2310  BP: (!) 141/66 133/60   Pulse: 81 75   Resp: 18 16   Temp: 100 F (37.8 C)  (!) 101.9 F (38.8 C)  TempSrc: Oral  Oral  SpO2: 96% 94%   Weight: 90.7 kg (200 lb)    Height: 5\' 6"  (1.676 m)     Wt Readings from Last 3 Encounters:  03/31/17 90.7 kg (200 lb)  03/05/17 82.9 kg (182 lb 11.2 oz)  02/26/17 90.7 kg (200 lb)    PHYSICAL EXAMINATION:  Physical Exam  Vitals reviewed. Constitutional: She is oriented to person, place, and time. She appears well-developed and well-nourished. No distress.  HENT:  Head: Normocephalic and atraumatic.  Mouth/Throat: Oropharynx is clear and moist.  Eyes: Pupils are equal, round, and reactive to light. Conjunctivae and EOM are normal. No scleral icterus.  Neck: Normal range of motion. Neck supple. No JVD present. No thyromegaly present.  Cardiovascular: Normal rate, regular rhythm and intact distal pulses.  Exam reveals no gallop and no friction rub.   No murmur heard. Respiratory: Effort normal and breath sounds normal. No respiratory distress. She has no wheezes. She has no rales.  GI: Soft. Bowel sounds are normal. She exhibits no distension. There is tenderness (Suprapubic).  Musculoskeletal: Normal range of motion. She exhibits no edema.  No arthritis, no gout  Lymphadenopathy:    She has no cervical adenopathy.  Neurological: She is alert and oriented to person, place, and time. No cranial  nerve deficit.  No dysarthria, no aphasia  Skin: Skin is warm and dry. No rash noted. No erythema.  Psychiatric: She has a normal mood and affect. Her behavior is normal. Judgment and thought content normal.    LABORATORY PANEL:   CBC  Recent Labs Lab 03/31/17 2123  WBC 13.9*  HGB 12.2  HCT 35.9  PLT 193   ------------------------------------------------------------------------------------------------------------------  Chemistries   Recent Labs Lab 03/31/17 2123  NA 135  K 4.6  CL 102  CO2 23  GLUCOSE 132*  BUN 29*  CREATININE 1.13*  CALCIUM 9.0  AST 27  ALT 17  ALKPHOS 71  BILITOT 1.6*   ------------------------------------------------------------------------------------------------------------------  Cardiac Enzymes No results for input(s): TROPONINI in the last 168 hours. ------------------------------------------------------------------------------------------------------------------  RADIOLOGY:  Dg Chest Port 1 [address]  Result Date: 03/31/2017 CLINICAL DATA:  Increasing confusion, recent UTIs EXAM: PORTABLE CHEST 1 VIEW COMPARISON:  01/13/2017 FINDINGS: Cardiac shadow is at the upper limits of normal in size but accentuated by the portable technique. The lungs are well aerated bilaterally. Elevation of the right hemidiaphragm is noted. No focal infiltrate or sizable effusion is seen. No bony abnormality is noted. IMPRESSION: No acute abnormality noted. Electronically Signed   By: Inez Catalina M.D.   On: 03/31/2017 22:33    EKG:   Orders placed or performed during the hospital encounter of 03/31/17  . ED EKG 12-Lead  . ED EKG 12-Lead    IMPRESSION AND PLAN:  Principal Problem:   Sepsis ([place]) - blood pressure stable, lactic acid within normal limits, sepsis due to UTI. IV antibiotics in place. Cultures sent from the ED. Active Problems:   UTI (urinary tract infection) - IV antibiotics and cultures as above   HTN (hypertension) - continue home meds   HLD  (hyperlipidemia) - continue home meds  All the records are reviewed and case discussed with ED provider. Management plans discussed with the patient and/or family.  DVT PROPHYLAXIS: SubQ lovenox  GI PROPHYLAXIS: None  ADMISSION STATUS: Inpatient  CODE STATUS: Full Code Status History    Date Active Date Inactive Code Status Order ID Comments User Context   01/10/2017 11:22 PM 01/14/2017  5:54 PM Full Code [id number]  Henreitta Leber, MD Inpatient      TOTAL TIME TAKING CARE OF THIS PATIENT: 45 minutes.   Shellie Goettl [place] 03/31/2017, 11:38 PM  [facility]  Office  [phone]  CC: Primary care physician; Megan Lamb, NP  Note:  This document was prepared using Dragon voice recognition software and may include unintentional dictation errors.

## 2017-04-01 ENCOUNTER — Encounter: Payer: Self-pay | Admitting: Surgery

## 2017-04-01 DIAGNOSIS — L8990 Pressure ulcer of unspecified site, unspecified stage: Secondary | ICD-10-CM | POA: Insufficient documentation

## 2017-04-01 DIAGNOSIS — R4182 Altered mental status, unspecified: Secondary | ICD-10-CM | POA: Diagnosis not present

## 2017-04-01 DIAGNOSIS — A419 Sepsis, unspecified organism: Secondary | ICD-10-CM | POA: Diagnosis not present

## 2017-04-01 LAB — CBC
HCT: 32.4 % — ABNORMAL LOW (ref 35.0–47.0)
Hemoglobin: 11.1 g/dL — ABNORMAL LOW (ref 12.0–16.0)
MCH: 29.5 pg (ref 26.0–34.0)
MCHC: 34.1 g/dL (ref 32.0–36.0)
MCV: 86.4 fL (ref 80.0–100.0)
PLATELETS: 166 10*3/uL (ref 150–440)
RBC: 3.76 MIL/uL — ABNORMAL LOW (ref 3.80–5.20)
RDW: 19.1 % — AB (ref 11.5–14.5)
WBC: 10.3 10*3/uL (ref 3.6–11.0)

## 2017-04-01 LAB — BASIC METABOLIC PANEL
Anion gap: 8 (ref 5–15)
BUN: 30 mg/dL — AB (ref 6–20)
CALCIUM: 8.4 mg/dL — AB (ref 8.9–10.3)
CHLORIDE: 102 mmol/L (ref 101–111)
CO2: 24 mmol/L (ref 22–32)
CREATININE: 1.38 mg/dL — AB (ref 0.44–1.00)
GFR calc Af Amer: 40 mL/min — ABNORMAL LOW (ref >60)
GFR calc non Af Amer: 35 mL/min — ABNORMAL LOW (ref >60)
GLUCOSE: 209 mg/dL — AB (ref 65–99)
Potassium: 3.7 mmol/L (ref 3.5–5.1)
Sodium: 134 mmol/L — ABNORMAL LOW (ref 135–145)

## 2017-04-01 LAB — LACTIC ACID, PLASMA: Lactic Acid, Venous: 1.6 mmol/L (ref 0.5–1.9)

## 2017-04-01 MED ORDER — VANCOMYCIN HCL IN DEXTROSE 1-5 GM/200ML-% IV SOLN
1000.0000 mg | INTRAVENOUS | Status: DC
  Administered 2017-04-01: 1000 mg via INTRAVENOUS
  Filled 2017-04-01: qty 200

## 2017-04-01 MED ORDER — ACETAMINOPHEN 325 MG PO TABS: 650.0000 mg | ORAL_TABLET | Freq: Four times a day (QID) | ORAL | Status: DC | PRN

## 2017-04-01 MED ORDER — METOPROLOL TARTRATE 25 MG PO TABS
25.0000 mg | ORAL_TABLET | Freq: Two times a day (BID) | ORAL | Status: DC
  Administered 2017-04-01 – 2017-04-02 (×4): 25 mg via ORAL
  Filled 2017-04-01 (×4): qty 1

## 2017-04-01 MED ORDER — SODIUM CHLORIDE 0.9 % IV SOLN
INTRAVENOUS | Status: DC
Start: 2017-04-01 — End: 2017-04-02
  Administered 2017-04-01: 10:00:00 via INTRAVENOUS

## 2017-04-01 MED ORDER — ONDANSETRON HCL 4 MG PO TABS: 4.0000 mg | ORAL_TABLET | Freq: Four times a day (QID) | ORAL | Status: DC | PRN

## 2017-04-01 MED ORDER — ATORVASTATIN CALCIUM 20 MG PO TABS
80.0000 mg | ORAL_TABLET | Freq: Every day | ORAL | Status: DC
  Administered 2017-04-01: 22:00:00 80 mg via ORAL
  Filled 2017-04-01: qty 4

## 2017-04-01 MED ORDER — VANCOMYCIN HCL IN DEXTROSE 1-5 GM/200ML-% IV SOLN
1000.0000 mg | Freq: Once | INTRAVENOUS | Status: AC
  Administered 2017-04-01: 1000 mg via INTRAVENOUS
  Filled 2017-04-01: qty 200

## 2017-04-01 MED ORDER — ONDANSETRON HCL 4 MG/2ML IJ SOLN: 4.0000 mg | Freq: Four times a day (QID) | INTRAMUSCULAR | Status: DC | PRN

## 2017-04-01 MED ORDER — ACETAMINOPHEN 650 MG RE SUPP
650.0000 mg | Freq: Four times a day (QID) | RECTAL | Status: DC | PRN
Start: 2017-04-01 — End: 2017-04-02

## 2017-04-01 MED ORDER — ASPIRIN EC 81 MG PO TBEC
81.0000 mg | DELAYED_RELEASE_TABLET | Freq: Every day | ORAL | Status: DC
  Administered 2017-04-01 – 2017-04-02 (×2): 81 mg via ORAL
  Filled 2017-04-01 (×2): qty 1

## 2017-04-01 MED ORDER — ENOXAPARIN SODIUM 40 MG/0.4ML ~~LOC~~ SOLN
40.0000 mg | SUBCUTANEOUS | Status: DC
  Administered 2017-04-01: 22:00:00 40 mg via SUBCUTANEOUS
  Filled 2017-04-01: qty 0.4

## 2017-04-01 MED ORDER — PIPERACILLIN-TAZOBACTAM 3.375 G IVPB
3.3750 g | Freq: Three times a day (TID) | INTRAVENOUS | Status: DC
  Administered 2017-04-01 – 2017-04-02 (×4): 3.375 g via INTRAVENOUS
  Filled 2017-04-01 (×4): qty 50

## 2017-04-01 NOTE — Progress Notes (Signed)
Pharmacy Antibiotic Note  Megan Hamilton is a 83 y.o. female admitted on 03/31/2017 with sepsis and UTI.  Pharmacy has been consulted for vancomycin and Zosyn dosing.  Plan: DW 70kg  Vd 49L kei 0.04 hr-1  T1/2 17 hours Vancomycin 1 gram q 18 hours ordered with stacked dosing. Level before 5th dose. Goal trough 15-20.  Zosyn 3.375 grams q 8 hours ordered   Height: 5\' 6"  (167.6 cm) Weight: 187 lb 3.2 oz (84.9 kg) IBW/kg (Calculated) : 59.3  Temp (24hrs), Avg:99.5 F (37.5 C), Min:98 F (36.7 C), Max:101.9 F (38.8 C)   Recent Labs Lab 03/31/17 2123 03/31/17 2136 04/01/17 0214  WBC 13.9*  --  10.3  CREATININE 1.13*  --  1.38*  LATICACIDVEN  --  1.1 1.6    Estimated Creatinine Clearance: 35.1 mL/min (A) (by C-G formula based on SCr of 1.38 mg/dL (H)).    Allergies  Allergen Reactions  . Cephalexin Hives  . Quinine Derivatives Nausea And Vomiting    Antimicrobials this admission: vancomycin Zosyn >> 7/25   >>   Dose adjustments this admission:   Microbiology results: 7/25 BCx: pending 7/25 UCx: pending       7/25 UA: LE(+) NO2(-) WBC TNTC 7/25 CXR: no acute abnormality Thank you for allowing pharmacy to be a part of this patient's care.  Megan Hamilton S 04/01/2017 3:12 AM

## 2017-04-01 NOTE — Progress Notes (Signed)
[place] at [place] NAME: Megan Hamilton    MR#:  [id number]  DATE OF BIRTH:  July 14, 1933  SUBJECTIVE:  CHIEF COMPLAINT:   Chief Complaint  Patient presents with  . Altered Mental Status    Pt. son states pt. not acting herself today.     Brought with weakness, lethargy- found to have sepsis. Today alert but confused and calm, when I saw in morning.  REVIEW OF SYSTEMS:   Pt is not very oriented, so not giving reliable ROS.  ROS  DRUG ALLERGIES:   Allergies  Allergen Reactions  . Cephalexin Hives  . Quinine Derivatives Nausea And Vomiting    VITALS:  Blood pressure (!) 138/52, pulse 80, temperature 98.2 F (36.8 C), temperature source Oral, resp. rate 16, height 5\' 6"  (1.676 m), weight 84.9 kg (187 lb 3.2 oz), SpO2 95 %.  PHYSICAL EXAMINATION:  GENERAL:  83 y.o.-year-old patient lying in the bed with no acute distress.  EYES: Pupils equal, round, reactive to light and accommodation. No scleral icterus. Extraocular muscles intact.  HEENT: Head atraumatic, normocephalic. Oropharynx and nasopharynx clear.  NECK:  Supple, no jugular venous distention. No thyroid enlargement, no tenderness.  LUNGS: Normal breath sounds bilaterally, no wheezing, rales,rhonchi or crepitation. No use of accessory muscles of respiration.  CARDIOVASCULAR: S1, S2 normal. No murmurs, rubs, or gallops.  ABDOMEN: Soft, nontender, nondistended. Bowel sounds present. No organomegaly or mass.  EXTREMITIES: No pedal edema, cyanosis, or clubbing.  NEUROLOGIC: Cranial nerves II through XII are intact. Muscle strength 3-4/5 in all extremities. Sensation intact. Gait not checked.  PSYCHIATRIC: The patient is alert and oriented x 1.  SKIN: No obvious rash, lesion, or ulcer.   Physical Exam LABORATORY PANEL:   CBC  Recent Labs Lab 04/01/17 0214  WBC 10.3  HGB 11.1*  HCT 32.4*  PLT 166    ------------------------------------------------------------------------------------------------------------------  Chemistries   Recent Labs Lab 03/31/17 2123 04/01/17 0214  NA 135 134*  K 4.6 3.7  CL 102 102  CO2 23 24  GLUCOSE 132* 209*  BUN 29* 30*  CREATININE 1.13* 1.38*  CALCIUM 9.0 8.4*  AST 27  --   ALT 17  --   ALKPHOS 71  --   BILITOT 1.6*  --    ------------------------------------------------------------------------------------------------------------------  Cardiac Enzymes No results for input(s): TROPONINI in the last 168 hours. ------------------------------------------------------------------------------------------------------------------  RADIOLOGY:  Dg Chest Port 1 View  Result Date: 03/31/2017 CLINICAL DATA:  Increasing confusion, recent UTIs EXAM: PORTABLE CHEST 1 VIEW COMPARISON:  01/13/2017 FINDINGS: Cardiac shadow is at the upper limits of normal in size but accentuated by the portable technique. The lungs are well aerated bilaterally. Elevation of the right hemidiaphragm is noted. No focal infiltrate or sizable effusion is seen. No bony abnormality is noted. IMPRESSION: No acute abnormality noted. Electronically Signed   By: Inez Catalina M.D.   On: 03/31/2017 22:33    ASSESSMENT AND PLAN:   Principal Problem:   Sepsis ([place]) Active Problems:   UTI (urinary tract infection)   HLD (hyperlipidemia)   HTN (hypertension)   Pressure injury of skin  * Sepsis ([place]) - blood pressure stable, lactic acid within normal limits, sepsis due to UTI. IV antibiotics . Cultures sent from the ED.   Stopped vanc , cont zosyn.   *  Acute lower UTI (urinary tract infection) - IV antibiotics and cultures as above  * essential HTN (hypertension) - continue home meds  * HLD (hyperlipidemia) - continue home  meds * hypokalemia   Replace oral.   PT eval to help discharge planning.  All the records are reviewed and case discussed with Care Management/Social  Workerr. Management plans discussed with the patient, family and they are in agreement.  CODE STATUS: full.  TOTAL TIME TAKING CARE OF THIS PATIENT:35 minutes.     POSSIBLE D/C IN 1-2 DAYS, DEPENDING ON CLINICAL CONDITION.   Vaughan Basta M.D on 04/01/2017   Between 7am to 6pm - Pager - [phone]  After 6pm go to [email or web address] - password [email or web address] [facility]  Office  [phone]  CC: Primary care physician; Verita Lamb, NP  Note: This dictation was prepared with Dragon dictation along with smaller phrase technology. Any transcriptional errors that result from this process are unintentional.

## 2017-04-01 NOTE — Progress Notes (Signed)
Patient transferred earlier this morning via stretcher from the ED to room 105 with family at the bedside. Patient appears to be very alert an oriented with periods of forgetfulness or confusion. Oriented patient to the room and room equipment and educated patient and family of fall policy and the importance of the bed alarm for the prevention of falls and overall safety.   Patient also has a stage 2 noted on sacral area. The patient's son states that it has been there for a while and have tried packing it but no successful healing of the wound. Is a circular open wound that is smaller than a penny but bigger than a pea. Wound bed is yellowish grey with no drainage nor is there any foul odor. Will put in a sticky note for a wound consult and notify oncoming nurse to see if wound consult can be ordered during day shift.  Currently complains of no pain or discomfort. Will continue to monitor patient to end of shift.

## 2017-04-02 ENCOUNTER — Ambulatory Visit: Payer: Medicare Other

## 2017-04-02 DIAGNOSIS — R4182 Altered mental status, unspecified: Secondary | ICD-10-CM | POA: Diagnosis not present

## 2017-04-02 DIAGNOSIS — A419 Sepsis, unspecified organism: Secondary | ICD-10-CM | POA: Diagnosis not present

## 2017-04-02 LAB — BASIC METABOLIC PANEL
ANION GAP: 7 (ref 5–15)
BUN: 28 mg/dL — ABNORMAL HIGH (ref 6–20)
CALCIUM: 8.6 mg/dL — AB (ref 8.9–10.3)
CO2: 25 mmol/L (ref 22–32)
Chloride: 105 mmol/L (ref 101–111)
Creatinine, Ser: 1.32 mg/dL — ABNORMAL HIGH (ref 0.44–1.00)
GFR calc non Af Amer: 37 mL/min — ABNORMAL LOW (ref >60)
GFR, EST AFRICAN AMERICAN: 43 mL/min — AB (ref >60)
Glucose, Bld: 121 mg/dL — ABNORMAL HIGH (ref 65–99)
POTASSIUM: 4 mmol/L (ref 3.5–5.1)
Sodium: 137 mmol/L (ref 135–145)

## 2017-04-02 LAB — URINE CULTURE

## 2017-04-02 MED ORDER — LEVOFLOXACIN 500 MG PO TABS: 500.0000 mg | ORAL_TABLET | Freq: Every day | ORAL | 0 refills | Status: AC

## 2017-04-02 NOTE — Discharge Summary (Signed)
Megan Hamilton at [place] NAME: Megan Hamilton    MR#:  [id number]  DATE OF BIRTH:  [birth year]  DATE OF ADMISSION:  03/31/2017 ADMITTING PHYSICIAN: Lance Coon, MD  DATE OF DISCHARGE: 04/02/2017  PRIMARY CARE PHYSICIAN: Megan Lamb, NP    ADMISSION DIAGNOSIS:  Pyelonephritis [N12] Sepsis, due to unspecified organism ([place]) [A41.9]  DISCHARGE DIAGNOSIS:  Principal Problem:   Sepsis ([place]) Active Problems:   UTI (urinary tract infection)   HLD (hyperlipidemia)   HTN (hypertension)   Pressure injury of skin   SECONDARY DIAGNOSIS:   Past Medical History:  Diagnosis Date  . Cancer [facility])    colorectal 2017  . Colorectal cancer ([place])   . History of kidney stones   . HLD (hyperlipidemia)   . Hypertension   . Stroke ([place]) 2017 x2 and 1993 x 1   x 3    HOSPITAL COURSE:   * Sepsis ([place]) - blood pressure stable, lactic acid within normal limits, sepsis due to UTI. IV antibiotics .   Cultures sent from the ED.   Stopped vanc , cont zosyn. She was significant improvement in her lethargic, back to her baseline after 2 days of treatment and IV fluids. Physical therapy suggested need of rehabilitation but patient and her family refused and they said they were able to take care of her at home with wheelchair and one of the family member staying home all the time. *  Acute lower UTI (urinary tract infection) - IV antibiotics and cultures as above *essential HTN (hypertension) - continue home meds *HLD (hyperlipidemia) - continue home meds * hypokalemia   Replace oral.  DISCHARGE CONDITIONS:   Stable  CONSULTS OBTAINED:    DRUG ALLERGIES:   Allergies  Allergen Reactions  . Cephalexin Hives  . Quinine Derivatives Nausea And Vomiting    DISCHARGE MEDICATIONS:   Current Discharge Medication List    START taking these medications   Details  levofloxacin (LEVAQUIN) 500 MG tablet Take 1 tablet (500 mg total) by mouth  daily. Qty: 3 tablet, Refills: 0      CONTINUE these medications which have NOT CHANGED   Details  acetaminophen (TYLENOL) 500 MG tablet Take 1,000 mg by mouth every 6 (six) hours as needed for fever.    Ascorbic Acid (VITAMIN C WITH ROSE HIPS) 500 MG tablet Take 500 mg by mouth 2 (two) times daily.     aspirin EC 81 MG tablet Take 81 mg by mouth daily.    atorvastatin (LIPITOR) 80 MG tablet Take 80 mg by mouth at bedtime.     ferrous sulfate 325 (65 FE) MG tablet Take 325 mg by mouth daily with breakfast.    loratadine (CLARITIN) 10 MG tablet Take 10 mg by mouth daily.    metoprolol tartrate (LOPRESSOR) 25 MG tablet Take 1 tablet (25 mg total) by mouth 2 (two) times daily. Qty: 60 tablet, Refills: 0    Multiple Vitamin (MULTIVITAMIN) tablet Take 1 tablet by mouth daily.    naproxen sodium (ANAPROX) 220 MG tablet Take 440 mg by mouth 2 (two) times daily as needed (for pain).    vitamin E 400 UNIT capsule Take 400 Units by mouth daily.      STOP taking these medications     docusate sodium (COLACE) 250 MG capsule          DISCHARGE INSTRUCTIONS:   Follow with PMD as needed.   If you experience worsening of your admission symptoms, develop  shortness of breath, life threatening emergency, suicidal or homicidal thoughts you must seek medical attention immediately by calling 911 or calling your MD immediately  if symptoms less severe.  You Must read complete instructions/literature along with all the possible adverse reactions/side effects for all the Medicines you take and that have been prescribed to you. Take any new Medicines after you have completely understood and accept all the possible adverse reactions/side effects.   Please note  You were cared for by a hospitalist during your hospital stay. If you have any questions about your discharge medications or the care you received while you were in the hospital after you are discharged, you can call the unit and asked to  speak with the hospitalist on call if the hospitalist that took care of you is not available. Once you are discharged, your primary care physician will handle any further medical issues. Please note that NO REFILLS for any discharge medications will be authorized once you are discharged, as it is imperative that you return to your primary care physician (or establish a relationship with a primary care physician if you do not have one) for your aftercare needs so that they can reassess your need for medications and monitor your lab values.    Today   CHIEF COMPLAINT:   Chief Complaint  Patient presents with  . Altered Mental Status    Pt. son states pt. not acting herself today.    HISTORY OF PRESENT ILLNESS:  Megan Hamilton  is a 83 y.o. female with Confusion, fever, dysuria. Patient was brought by family when they noticed her symptoms. Here she was found to meet sepsis criteria, and has a UTI. Hospitals were called for admission    VITAL SIGNS:  Blood pressure 138/79, pulse 70, temperature 98.2 F (36.8 C), temperature source Oral, resp. rate 16, height 5\' 6"  (1.676 m), weight 84.9 kg (187 lb 3.2 oz), SpO2 94 %.  I/O:   Intake/Output Summary (Last 24 hours) at 04/02/17 1656 Last data filed at 04/02/17 1400  Gross per 24 hour  Intake             2360 ml  Output                0 ml  Net             2360 ml    PHYSICAL EXAMINATION:  GENERAL:  83 y.o.-year-old patient lying in the bed with no acute distress.  EYES: Pupils equal, round, reactive to light and accommodation. No scleral icterus. Extraocular muscles intact.  HEENT: Head atraumatic, normocephalic. Oropharynx and nasopharynx clear.  NECK:  Supple, no jugular venous distention. No thyroid enlargement, no tenderness.  LUNGS: Normal breath sounds bilaterally, no wheezing, rales,rhonchi or crepitation. No use of accessory muscles of respiration.  CARDIOVASCULAR: S1, S2 normal. No murmurs, rubs, or gallops.  ABDOMEN: Soft,  non-tender, non-distended. Bowel sounds present. No organomegaly or mass.  EXTREMITIES: No pedal edema, cyanosis, or clubbing.  NEUROLOGIC: Cranial nerves II through XII are intact. Muscle strength 3-4/5 in all extremities more weak on left side. Sensation intact. Gait not checked.  PSYCHIATRIC: The patient is alert and oriented x 3.  SKIN: No obvious rash, lesion, or ulcer.   DATA REVIEW:   CBC  Recent Labs Lab 04/01/17 0214  WBC 10.3  HGB 11.1*  HCT 32.4*  PLT 166    Chemistries   Recent Labs Lab 03/31/17 2123  04/02/17 0310  NA 135  < > 137  K 4.6  < > 4.0  CL 102  < > 105  CO2 23  < > 25  GLUCOSE 132*  < > 121*  BUN 29*  < > 28*  CREATININE 1.13*  < > 1.32*  CALCIUM 9.0  < > 8.6*  AST 27  --   --   ALT 17  --   --   ALKPHOS 71  --   --   BILITOT 1.6*  --   --   < > = values in this interval not displayed.  Cardiac Enzymes No results for input(s): TROPONINI in the last 168 hours.  Microbiology Results  Results for orders placed or performed during the hospital encounter of 03/31/17  Blood Culture (routine x 2)     Status: None (Preliminary result)   Collection Time: 03/31/17  9:36 PM  Result Value Ref Range Status   Specimen Description BLOOD LEFT ANTECUBITAL  Final   Special Requests   Final    BOTTLES DRAWN AEROBIC AND ANAEROBIC Blood Culture adequate volume   Culture NO GROWTH 2 DAYS  Final   Report Status PENDING  Incomplete  Blood Culture (routine x 2)     Status: None (Preliminary result)   Collection Time: 03/31/17  9:36 PM  Result Value Ref Range Status   Specimen Description BLOOD BLOOD LEFT HAND  Final   Special Requests   Final    BOTTLES DRAWN AEROBIC AND ANAEROBIC Blood Culture adequate volume   Culture NO GROWTH 2 DAYS  Final   Report Status PENDING  Incomplete  Urine culture     Status: Abnormal   Collection Time: 03/31/17  9:36 PM  Result Value Ref Range Status   Specimen Description URINE, RANDOM  Final   Special Requests NONE  Final    Culture MULTIPLE ORGANISMS PRESENT, NONE PREDOMINANT (A)  Final   Report Status 04/02/2017 FINAL  Final    RADIOLOGY:  Dg Chest Port 1 View  Result Date: 03/31/2017 CLINICAL DATA:  Increasing confusion, recent UTIs EXAM: PORTABLE CHEST 1 VIEW COMPARISON:  01/13/2017 FINDINGS: Cardiac shadow is at the upper limits of normal in size but accentuated by the portable technique. The lungs are well aerated bilaterally. Elevation of the right hemidiaphragm is noted. No focal infiltrate or sizable effusion is seen. No bony abnormality is noted. IMPRESSION: No acute abnormality noted. Electronically Signed   By: Inez Catalina M.D.   On: 03/31/2017 22:33    EKG:   Orders placed or performed during the hospital encounter of 03/31/17  . ED EKG 12-Lead  . ED EKG 12-Lead      Management plans discussed with the patient, family and they are in agreement.  CODE STATUS:     Code Status Orders        Start     Ordered   04/01/17 0223  Full code  Continuous     04/01/17 0222    Code Status History    Date Active Date Inactive Code Status Order ID Comments User Context   01/10/2017 11:22 PM 01/14/2017  5:54 PM Full Code [id number]  Henreitta Leber, MD Inpatient    Advance Directive Documentation     Most Recent Value  Type of Advance Directive  Living will, Healthcare Power of Attorney  Pre-existing out of facility DNR order (yellow form or pink MOST form)  -  "MOST" Form in Place?  -      TOTAL TIME TAKING CARE OF THIS PATIENT: 35 minutes.  Vaughan Basta M.D on 04/02/2017 at 4:56 PM  Between 7am to 6pm - Pager - [phone]  After 6pm go to [email or web address] - password [email or web address] [place] [facility]  Office  [phone]  CC: Primary care physician; Megan Lamb, NP   Note: This dictation was prepared with Dragon dictation along with smaller phrase technology. Any transcriptional errors that result from this process are unintentional.

## 2017-04-02 NOTE — Progress Notes (Signed)
PT Cancellation Note  Patient Details Name: GLADA WICKSTROM MRN: [id number] DOB: 20-Aug-1933   Cancelled Treatment:    Reason Eval/Treat Not Completed: Patient declined, no reason specified (Consult received and chart reviewed.  Patient consistently refusing attempts at therapy despite max encouragement from therapist.  Unable to redirect/reason to promote participation.  Will continue efforts and initiate evaluation as patient allows; may attempt at a time that daughter is present if possible.)   Rondell Frick H. Owens Shark, PT, DPT, NCS 04/02/17, 10:13 AM [phone]

## 2017-04-02 NOTE — Evaluation (Signed)
Physical Therapy Evaluation Patient Details Name: Megan Hamilton MRN: [id number] DOB: 27-Oct-1932 Today's Date: 04/02/2017   History of Present Illness  presented to ER secondary to AMS, fever and dysuria; admitted with sepsis related to UTI.  Clinical Impression  Patient currently requiring extensive assist for all functional activities: mod/max assist for bed mobility, mod assist +1-2 with RW for sit/stand, SPT between seating surfaces.  Heavy R lateral lean (L trunk elongation) with all sitting/standing activities, requiring constant hands-on assist to prevent LOB/fall throughout session.  Very unsteady with absent spontaneous righting/balance reactions.  Very high fall risk. Would benefit from skilled PT to address above deficits and promote optimal return to PLOF; recommend transition to STR upon discharge from acute hospitalization.     Follow Up Recommendations SNF    Equipment Recommendations       Recommendations for Other Services       Precautions / Restrictions Precautions Precautions: Fall Restrictions Weight Bearing Restrictions: No      Mobility  Bed Mobility Overal bed mobility: Needs Assistance Bed Mobility: Supine to Sit     Supine to sit: Mod assist;Max assist     General bed mobility comments: assist for LE management and truncal elevation  Transfers Overall transfer level: Needs assistance Equipment used: Rolling walker (2 wheeled) Transfers: Sit to/from [facility] Sit to Stand: Mod assist;+2 physical assistance         General transfer comment: significant cuing for foot placement, assist for standing balance and midlien orientation due to heavy R lateral lean/pushing at times  Ambulation/Gait             General Gait Details: unsafe/unable  Stairs            Wheelchair Mobility    Modified Rankin (Stroke Patients Only)       Balance Overall balance assessment: Needs assistance Sitting-balance support:  No upper extremity supported;Feet supported Sitting balance-Leahy Scale: Fair     Standing balance support: Bilateral upper extremity supported Standing balance-Leahy Scale: Zero Standing balance comment: heavy R lateral lean, minimal/no attempts at self-correction                             Pertinent Vitals/Pain Pain Assessment: No/denies pain    Home Living Family/patient expects to be discharged to:: Private residence Living Arrangements: Children Available Help at Discharge: Family;Available 24 hours/day Type of Home: House Home Access: Ramped entrance     Home Layout: One level Home Equipment: Walker - 2 wheels;Grab bars - tub/shower;Shower seat      Prior Function Level of Independence: Needs assistance   Gait / Transfers Assistance Needed: Pt ambulates with use of rolling walker limited househodl distances. Mostly stays at home  ADL's / Homemaking Assistance Needed: Assist from daughter with ADLs/IADIs.   Comments: Denies fall history     Hand Dominance        Extremity/Trunk Assessment   Upper Extremity Assessment Upper Extremity Assessment: Generalized weakness (grossly 4-/5 throughout bilat UEs)    Lower Extremity Assessment Lower Extremity Assessment: Generalized weakness (grossly 3/5 throughout; L LE lacking approx 15-20 degrees passive DF)    Cervical / Trunk Assessment Cervical / Trunk Assessment:  (significant elongation of L lateral trunk; R lateral lean)  Communication   Communication: No difficulties  Cognition Arousal/Alertness: Awake/alert Behavior During Therapy: WFL for tasks assessed/performed Overall Cognitive Status: Difficult to assess  General Comments: oriented to self, location; follows simple commands      General Comments      Exercises Other Exercises Other Exercises: SPT, bed/BSC, mod assist +2 with RW.  Heavy R lateral lean, difficulty with foot placement to  promote midline orientation. Other Exercises: Standing balance with RW for hygiene, clothing management, heavy mod assist +1; dep of second person for hygiene and clothing management. Other Exercises: Toileting routine performed x2 during session due to bladder incontinence with all transfer attempts (patient with inability to control bladder).   Assessment/Plan    PT Assessment Patient needs continued PT services  PT Problem List Decreased strength;Decreased range of motion;Decreased activity tolerance;Decreased balance;Decreased mobility;Decreased coordination;Decreased cognition;Decreased knowledge of use of DME;Decreased safety awareness;Decreased knowledge of precautions       PT Treatment Interventions DME instruction;Functional mobility training;Therapeutic activities;Therapeutic exercise;Gait training;Balance training;Neuromuscular re-education;Cognitive remediation;Patient/family education    PT Goals (Current goals can be found in the Care Plan section)  Acute Rehab PT Goals Patient Stated Goal: to have my daughter help me PT Goal Formulation: With patient Time For Goal Achievement: 04/16/17 Potential to Achieve Goals: Fair    Frequency Min 2X/week   Barriers to discharge        Co-evaluation               AM-PAC PT "6 Clicks" Daily Activity  Outcome Measure Difficulty turning over in bed (including adjusting bedclothes, sheets and blankets)?: Total Difficulty moving from lying on back to sitting on the side of the bed? : Total Difficulty sitting down on and standing up from a chair with arms (e.g., wheelchair, bedside commode, etc,.)?: Total Help needed moving to and from a bed to chair (including a wheelchair)?: Total Help needed walking in hospital room?: Total Help needed climbing 3-5 steps with a railing? : Total 6 Click Score: 6    End of Session Equipment Utilized During Treatment: Gait belt Activity Tolerance: Patient limited by fatigue Patient left:  in chair;with call bell/phone within reach;with chair alarm set;with nursing/sitter in room Nurse Communication: Mobility status PT Visit Diagnosis: Unsteadiness on feet (R26.81);Difficulty in walking, not elsewhere classified (R26.2);Muscle weakness (generalized) (M62.81)    Time: 0037-0488 PT Time Calculation (min) (ACUTE ONLY): 59 min   Charges:   PT Evaluation $PT Eval Low Complexity: 1 Procedure PT Treatments $Therapeutic Activity: 38-52 mins   PT G Codes:   PT G-Codes **NOT FOR INPATIENT CLASS** Functional Assessment Tool Used: AM-PAC 6 Clicks Basic Mobility Functional Limitation: Mobility: Walking and moving around Mobility: Walking and Moving Around Current Status (Q9169): At least 80 percent but less than 100 percent impaired, limited or restricted Mobility: Walking and Moving Around Goal Status [phone]): At least 20 percent but less than 40 percent impaired, limited or restricted    Myshawn Chir[place]oga H. Owens Shark, PT, DPT, NCS 04/02/17, 9:08 PM [phone]

## 2017-04-02 NOTE — Plan of Care (Signed)
Problem: Education: Goal: Knowledge of treatment and prevention of UTI/Pyleonephritis will improve Outcome: Completed/Met Date Met: 04/02/17 family

## 2017-04-02 NOTE — Care Management Note (Signed)
Case Management Note  Patient Details  Name: Megan Hamilton MRN: [id number] Date of Birth: 11-Mar-1933  Subjective/Objective:  Admitted to [facility] with the diagnosis of sepsis. Lives with daughter, [name] [phone]). Last seen Dr. Sherryle Lis at [facility] 03/17/17. Prescriptions are filled at [facility] in [place]. Home Health in the past. Doesn't remember name of agency. [place] 01/2017. Rolling walker and grab bars in the home. [facility] in the past. Takes care of all basic activities of daily living herself, doesn't drive. No Falls. Good appetite.    Family will transport.                Action/Plan: Physical therapy evaluation pending.    Expected Discharge Date:  04/04/17               Expected Discharge Plan:     In-House Referral:     Discharge planning Services     Post Acute Care Choice:    Choice offered to:     DME Arranged:    DME Agency:     HH Arranged:    HH Agency:     Status of Service:     If discussed at [employer], dates discussed:    Additional Comments:  Shelbie Ammons, [facility] [phone] 04/02/2017, 9:13 AM

## 2017-04-02 NOTE — Care Management (Signed)
Discharge to home today per Dr. Anselm Jungling. Spoke with daughter, [name] [phone]). States that she takes care of her mother and is declining home services at this time. Son will transport his mother home this afternoon. Shelbie Ammons RN CCM Care Management [phone]

## 2017-04-02 NOTE — Progress Notes (Addendum)
Pt discharged home with daughter.  IV site DCd, bleeding controlled, Tele monitor removed cleaned and placed in med room, patient up in chair, colostomy bag changed earlier in shift, DC envelope and ABX scrip given to daughter, along with DC instructions and f/u appointment instructions.  Leaving hospital in car with her daughter

## 2017-04-02 NOTE — Plan of Care (Signed)
Problem: Education: Goal: Knowledge of [facility] Education information/materials will improve Outcome: Progressing VSS, free of falls during shift.  Denies pain, no needs overnight.  Colostomy pouch CDI.  Refused q2h repositioning.  Bed in low position, call bell within reach.  WCTM.

## 2017-04-02 NOTE — Progress Notes (Signed)
Per Dr Anselm Jungling it is Ok to stop fluids and cardiac monitor

## 2017-04-02 NOTE — Progress Notes (Signed)
Pharmacy Antibiotic Note  Megan Hamilton is a 83 y.o. female admitted on 03/31/2017 with sepsis and UTI.  Pharmacy has been consulted for vancomycin and Zosyn dosing. Vancomycin d/c'd.  Plan:  Continue Zosyn 3.375 grams IV q 8 hours EI ordered   Height: 5\' 6"  (167.6 cm) Weight: 187 lb 3.2 oz (84.9 kg) IBW/kg (Calculated) : 59.3  Temp (24hrs), Avg:98.2 F (36.8 C), Min:97.9 F (36.6 C), Max:98.3 F (36.8 C)   Recent Labs Lab 03/31/17 2123 03/31/17 2136 04/01/17 0214 04/02/17 0310  WBC 13.9*  --  10.3  --   CREATININE 1.13*  --  1.38* 1.32*  LATICACIDVEN  --  1.1 1.6  --     Estimated Creatinine Clearance: 36.7 mL/min (A) (by C-G formula based on SCr of 1.32 mg/dL (H)).    Allergies  Allergen Reactions  . Cephalexin Hives  . Quinine Derivatives Nausea And Vomiting    Antimicrobials this admission: vancomycin 7/26 >>7/26 Zosyn 7/25 >>   Dose adjustments this admission:  Microbiology results: 7/25 BCx: NGTD x2 days 7/25 UCx: multiple spp    7/25 UA: LE(+) NO2(-) WBC TNTC 7/25 CXR: no acute abnormality Thank you for allowing pharmacy to be a part of this patient's care.  Rocky Morel 04/02/2017 11:02 AM

## 2017-04-05 LAB — CULTURE, BLOOD (ROUTINE X 2)
CULTURE: NO GROWTH
Culture: NO GROWTH
SPECIAL REQUESTS: ADEQUATE
SPECIAL REQUESTS: ADEQUATE

## 2017-04-13 ENCOUNTER — Ambulatory Visit
Admission: RE | Admit: 2017-04-13 | Discharge: 2017-04-13 | Disposition: A | Discharge status: Home / Self Care | Payer: Medicare Other | Source: Ambulatory Visit | Attending: Urology | Admitting: Urology

## 2017-04-13 DIAGNOSIS — N200 Calculus of kidney: Secondary | ICD-10-CM | POA: Diagnosis not present

## 2017-04-13 DIAGNOSIS — N201 Calculus of ureter: Secondary | ICD-10-CM | POA: Diagnosis present

## 2017-04-15 ENCOUNTER — Encounter: Payer: Self-pay | Admitting: Urology

## 2017-04-15 ENCOUNTER — Ambulatory Visit (INDEPENDENT_AMBULATORY_CARE_PROVIDER_SITE_OTHER): Payer: Medicare Other | Admitting: Urology

## 2017-04-15 VITALS — BP 126/73 | HR 66 | Ht 66.0 in | Wt 187.0 lb

## 2017-04-15 DIAGNOSIS — N200 Calculus of kidney: Secondary | ICD-10-CM

## 2017-04-15 NOTE — Progress Notes (Signed)
04/15/2017 11:58 AM   Megan Hamilton 30-Jun-1933 [id number]  Referring provider: Verita Lamb, NP [address] [name], [place] [address]  Chief Complaint  Patient presents with  . Nephrolithiasis    HPI: The patient is an 83 year old female presents today for post instrumentation renal ultrasond after removing a 1.8 cm proximal right ureteral stone. She originally had nephrostomy tube placed in the setting of a Proteus and Enterobacter UTI. She  eventually underwent antegrade right ureteral stent placement followed by right ureteroscopy that was successful.   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 It was unclear if it was partially duplicated or completely duplicated as no second ureteral orifice according to the Weigert-Meyer Rule or branch from a partial duplication was able to be located.    Renal ultrasound reviewed without hydronephrosis. There is a small in the the right central portion of the kidney that maybe fragments collected of residual stone though all large fragments were removed visually during endoscopies so this may also be artifact. I do not think she has any significant stone burden based on this study and visual inspection of her collecting system during surgery. 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  Stone analysis revealed 2% calcium oxalate monohydrate, 30% calcium phosphate, and 68% and is using ammonium phosphate.  PMH: Past Medical History:  Diagnosis Date  . Cancer [facility])    colorectal 2017  . Colorectal cancer ([place])   . History of kidney stones   . HLD (hyperlipidemia)   . Hypertension   . Stroke ([place]) 2017 x2 and 1993 x 1   x 3    Surgical History: Past Surgical History:  Procedure Laterality Date  . ABDOMINAL HYSTERECTOMY    . CHOLECYSTECTOMY    . COLON SURGERY    . COLOSTOMY  2017    . CYSTOSCOPY W/ URETERAL STENT PLACEMENT Right 02/26/2017   Procedure: CYSTOSCOPY WITH STENT REPLACEMENT;  Surgeon: Nickie Retort, MD;  Location: [facility] ORS;  Service: Urology;  Laterality: Right;  . IR CONVERT RIGHT NEPHROSTOMY TO NEPHROURETERAL CATH  02/05/2017  . IR NEPHROSTOMY PLACEMENT RIGHT  01/10/2017  . URETEROSCOPY WITH HOLMIUM LASER LITHOTRIPSY Right 02/26/2017   Procedure: URETEROSCOPY WITH HOLMIUM LASER LITHOTRIPSY;  Surgeon: Nickie Retort, MD;  Location: [facility] ORS;  Service: Urology;  Laterality: Right;    Home Medications:  Allergies as of 04/15/2017      Reactions   Cephalexin Hives   Quinine Derivatives Nausea And Vomiting      Medication List       Accurate as of 04/15/17 11:58 AM. Always use your most recent med list.          acetaminophen 500 MG tablet Commonly known as:  TYLENOL Take 1,000 mg by mouth every 6 (six) hours as needed for fever.   aspirin EC 81 MG tablet Take 81 mg by mouth daily.   atorvastatin 80 MG tablet Commonly known as:  LIPITOR Take 80 mg by mouth at bedtime.   docusate sodium 100 MG capsule Commonly known as:  COLACE Take 100 mg by mouth 2 (two) times daily.   ferrous sulfate 325 (65 FE) MG tablet Take 325 mg by mouth daily with breakfast.   loratadine 10 MG tablet Commonly known as:  CLARITIN Take 10 mg by mouth daily.   metoprolol tartrate 25 MG tablet Commonly known  as:  LOPRESSOR Take 1 tablet (25 mg total) by mouth 2 (two) times daily.   multivitamin tablet Take 1 tablet by mouth daily.   naproxen sodium 220 MG tablet Commonly known as:  ANAPROX Take 440 mg by mouth 2 (two) times daily as needed (for pain).   vitamin C with rose hips 500 MG tablet Take 500 mg by mouth 2 (two) times daily.   vitamin E 400 UNIT capsule Take 400 Units by mouth daily.       Allergies:  Allergies  Allergen Reactions  . Cephalexin Hives  . Quinine Derivatives Nausea And Vomiting    Family History: Family History   Problem Relation Age of Onset  . Diabetes Mother   . Stroke Father   . Bladder Cancer Neg Hx   . Kidney cancer Neg Hx     Social History:  reports that she has never smoked. She has never used smokeless tobacco. She reports that she does not drink alcohol or use drugs.  ROS: UROLOGY Frequent Urination?: No Hard to postpone urination?: No Burning/pain with urination?: No Get up at night to urinate?: No Leakage of urine?: No Urine stream starts and stops?: No Trouble starting stream?: No Do you have to strain to urinate?: No Blood in urine?: No Urinary tract infection?: No Sexually transmitted disease?: No Injury to kidneys or bladder?: No Painful intercourse?: No Weak stream?: No Currently pregnant?: No Vaginal bleeding?: No Last menstrual period?: n  Gastrointestinal Nausea?: No Vomiting?: No Indigestion/heartburn?: No Diarrhea?: No Constipation?: No  Constitutional Fever: No Night sweats?: No Weight loss?: No Fatigue?: No  Skin Skin rash/lesions?: No Itching?: No  Eyes Blurred vision?: No Double vision?: No  Ears/Nose/Throat Sore throat?: No Sinus problems?: No  Hematologic/Lymphatic Swollen glands?: No Easy bruising?: No  Cardiovascular Leg swelling?: No Chest pain?: No  Respiratory Cough?: No Shortness of breath?: No  Endocrine Excessive thirst?: No  Musculoskeletal Back pain?: No Joint pain?: No  Neurological Headaches?: No Dizziness?: No  Psychologic Depression?: No Anxiety?: No  Physical Exam: BP 126/73 (BP Location: Left Arm, Patient Position: Sitting, Cuff Size: Normal)   Pulse 66   Ht 5\' 6"  (1.676 m)   Wt 187 lb (84.8 kg)   BMI 30.18 kg/m   Constitutional:  Alert and oriented, No acute distress. HEENT: [place] AT, moist mucus membranes.  Trachea midline, no masses. Cardiovascular: No clubbing, cyanosis, or edema. Respiratory: Normal respiratory effort, no increased work of breathing. GI: Abdomen is soft, nontender,  nondistended, no abdominal masses GU: No CVA tenderness.  Skin: No rashes, bruises or suspicious lesions. Lymph: No cervical or inguinal adenopathy. Neurologic: Grossly intact, no focal deficits, moving all 4 extremities. Psychiatric: Normal mood and affect.  Laboratory Data: Lab Results  Component Value Date   WBC 10.3 04/01/2017   HGB 11.1 (L) 04/01/2017   HCT 32.4 (L) 04/01/2017   MCV 86.4 04/01/2017   PLT 166 04/01/2017    Lab Results  Component Value Date   CREATININE 1.32 (H) 04/02/2017    No results found for: PSA  No results found for: TESTOSTERONE  No results found for: HGBA1C  Urinalysis    Component Value Date/Time   COLORURINE YELLOW (A) 03/31/2017 2136   APPEARANCEUR CLOUDY (A) 03/31/2017 2136   LABSPEC 1.018 03/31/2017 2136   PHURINE 5.0 03/31/2017 2136   GLUCOSEU NEGATIVE 03/31/2017 2136   HGBUR LARGE (A) 03/31/2017 2136   BILIRUBINUR NEGATIVE 03/31/2017 2136   [employer] NEGATIVE 03/31/2017 2136   PROTEINUR 100 (A) 03/31/2017 2136  NITRITE NEGATIVE 03/31/2017 2136   LEUKOCYTESUR LARGE (A) 03/31/2017 2136     Assessment & Plan:  1. History nephrolithiasis I discussed the patient's renal ultrasound results and stone analysis at this time. Given her age, I do not think she needs further follow-up unless she develops recurrent symptoms of nephrolithiasis. She'll follow-up with [place] as needed.  Return if symptoms worsen or fail to improve.  Nickie Retort, MD  [facility] [address] [place], [place] [address] [phone]

## 2018-08-12 IMAGING — XA IR NEPHROSTOMY PLACEMENT RIGHT
2 series · 3 of 3 positions shown · non-contrast
Comparison: CT of the abdomen and pelvis on 01/10/2017

CLINICAL DATA: Sepsis and obstructing calculus at the right
ureteropelvic junction.

EXAM:
1. ULTRASOUND GUIDANCE FOR PUNCTURE OF THE RIGHT RENAL COLLECTING
SYSTEM.
2. RIGHT PERCUTANEOUS NEPHROSTOMY TUBE PLACEMENT.

[Series 1: fl - angio · 1 of 1 slices shown (1 of 2)]
[im 1/1]
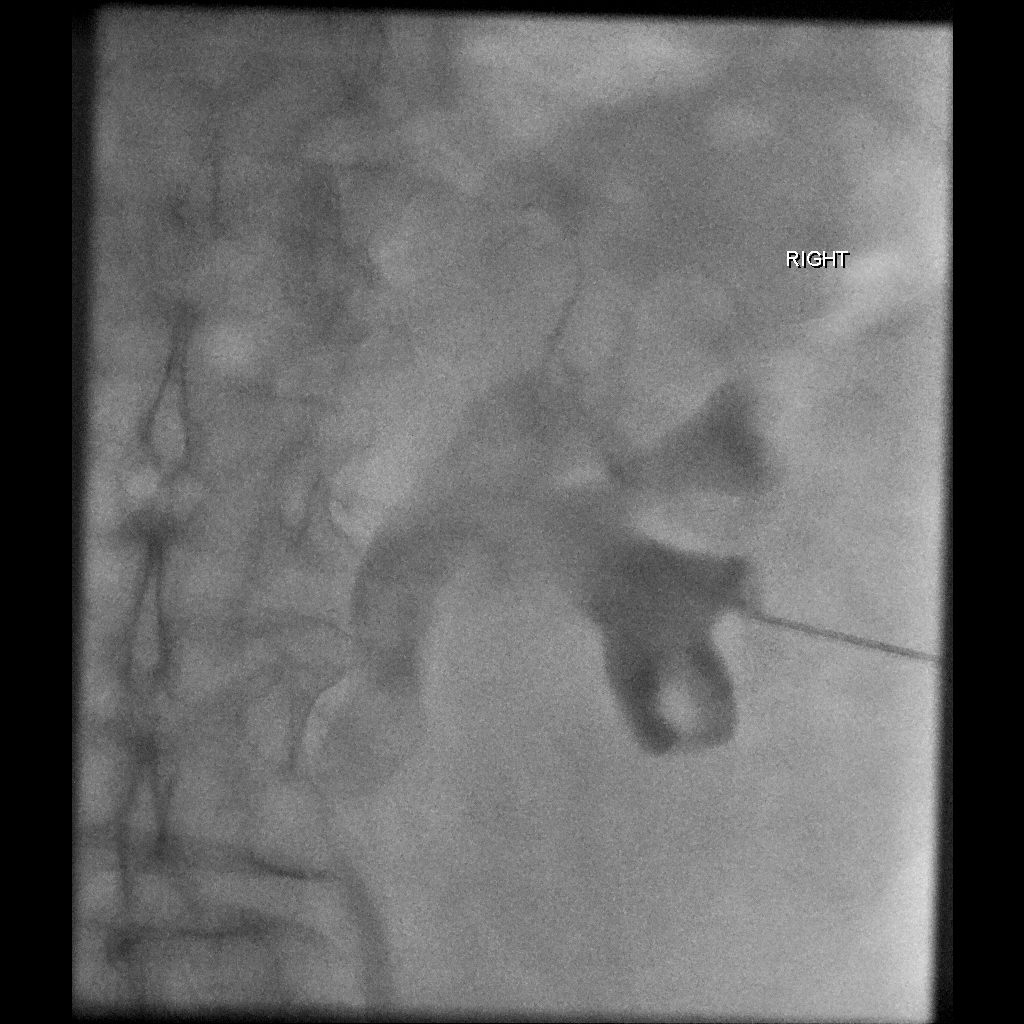

[Series 2: fl - angio · 2 of 2 slices shown (2 of 2)]
[im 1/2]
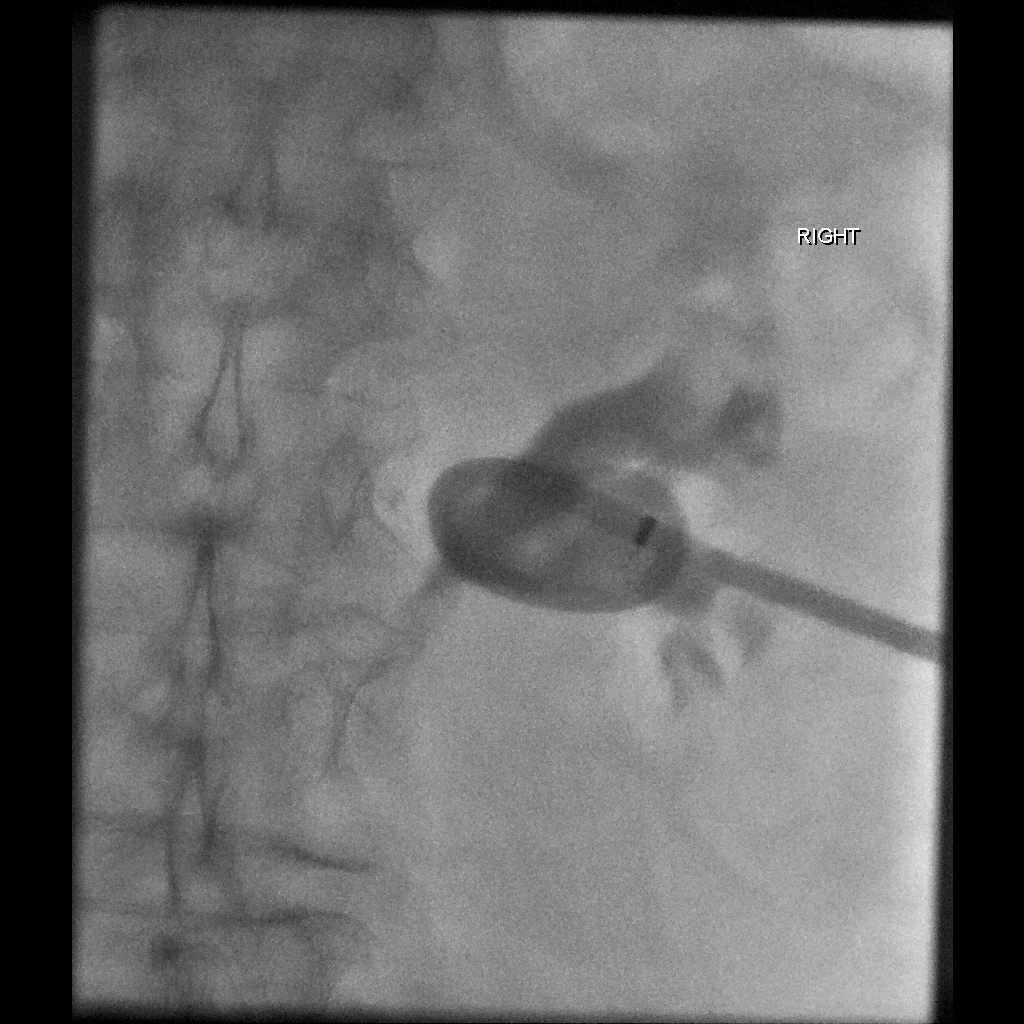
[im 2/2]
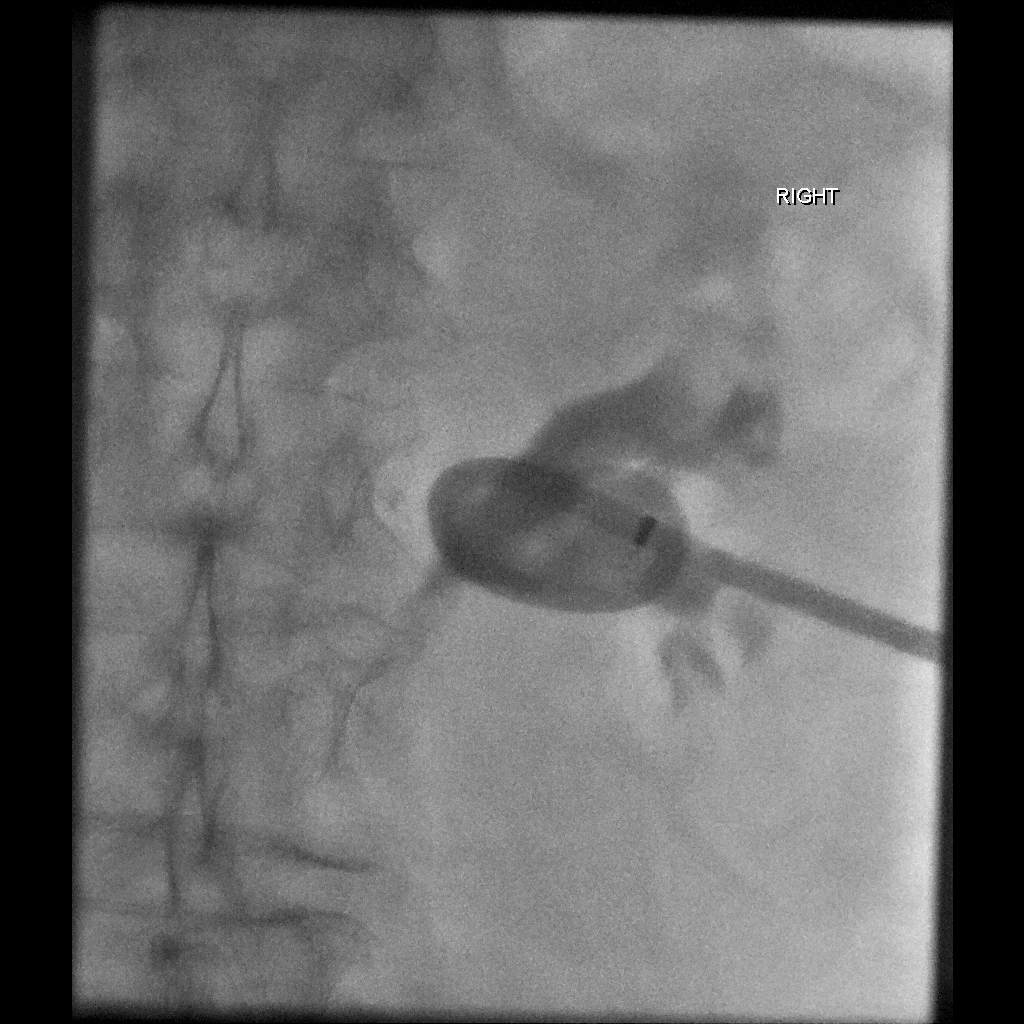

[3 of 3 positions shown; findings below may reference images not displayed]

ANESTHESIA/SEDATION:
0.5 mg IV Versed; 25 mcg IV Fentanyl.

Total Moderate Sedation Time

30 minutes.

The patient's level of consciousness and physiologic status were
continuously monitored during the procedure by Radiology nursing.

CONTRAST:  15 ml Isovue 300

MEDICATIONS:
No additional medications administered.

FLUOROSCOPY TIME:  1 minutes and 6 seconds.  UMmIy.

PROCEDURE:
The procedure, risks, benefits, and alternatives were explained to
the patient's son and daughter. Questions regarding the procedure
were encouraged and answered. The patient's son understands and
consents to the procedure.

The right flank region was prepped with Betadine in a sterile
fashion, and a sterile drape was applied covering the operative
field. A sterile gown and sterile gloves were used for the
procedure. Local anesthesia was provided with 1% Lidocaine.

Ultrasound was used to localize the right kidney. Under direct
ultrasound guidance, a 21 gauge needle was advanced into the renal
collecting system. Ultrasound image documentation was performed.
Aspiration of urine sample was performed followed by contrast
injection.

A transitional dilator was advanced over a guidewire. Percutaneous
tract dilatation was then performed over the guidewire. A 10 -French
percutaneous nephrostomy tube was then advanced and formed in the
collecting system. Catheter position was confirmed by fluoroscopy
after contrast injection.

The catheter was secured at the skin with a Prolene retention suture
and Stat-Lock device. A gravity bag was placed.

COMPLICATIONS:
None.
FINDINGS: After puncture of the right renal collecting system from a lower
pole approach, aspiration yielded grossly purulent urine. A sample
was sent for culture analysis. The nephrostomy tube was formed in
the renal pelvis.
IMPRESSION: Placement of 10 French percutaneous nephrostomy tube on the right
which was formed at the level of the renal pelvis. There was return
of grossly purulent urine consistent with pyonephrosis. A sample was
sent for culture analysis.

## 2018-09-07 IMAGING — XA IR CONVERT NEPHROMSTOMY TO NEPHROURETERAL CATH RIGHT
5 series · 9 of 9 positions shown · non-contrast
Comparison: None.

INDICATION: 83-year-old with a right renal pelvic stone. Patient has a right
percutaneous nephrostomy tube and needs conversion to an internal
stent.

EXAM:
CONVERSION OF A RIGHT NEPHROSTOMY TUBE TO AN ANTEGRADE RIGHT
URETERAL STENT

[Series 1: fl - angio · 1 of 1 slices shown (1 of 2)]
[im 1/1]
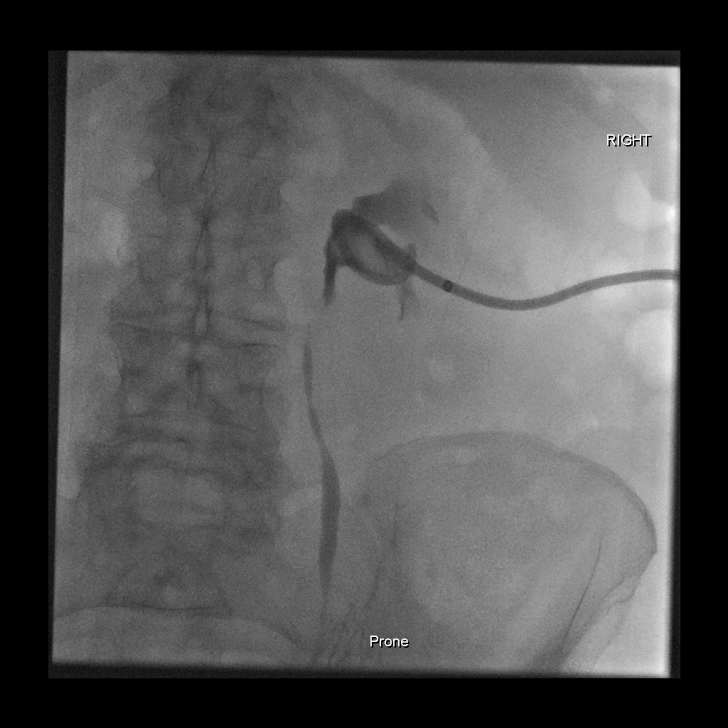

[Series 2: single · 2 of 2 slices shown (1 of 3)]
[im 1/2]
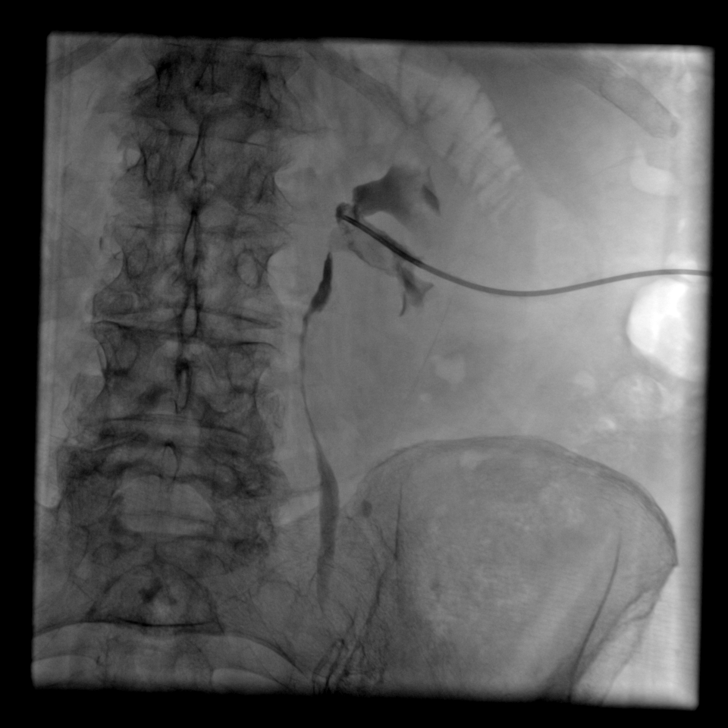
[im 2/2]
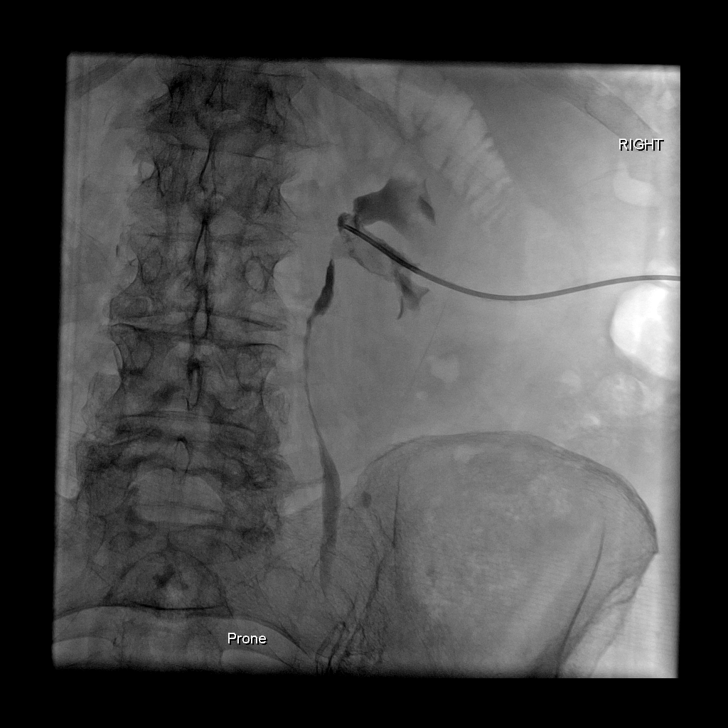

[Series 3: single · 3 of 3 slices shown (2 of 3)]
[im 1/3]
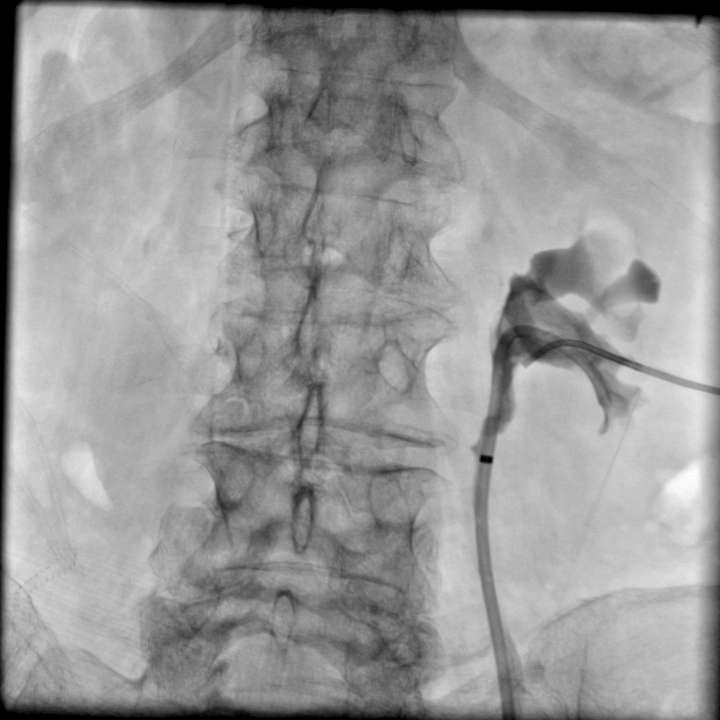
[im 2/3]
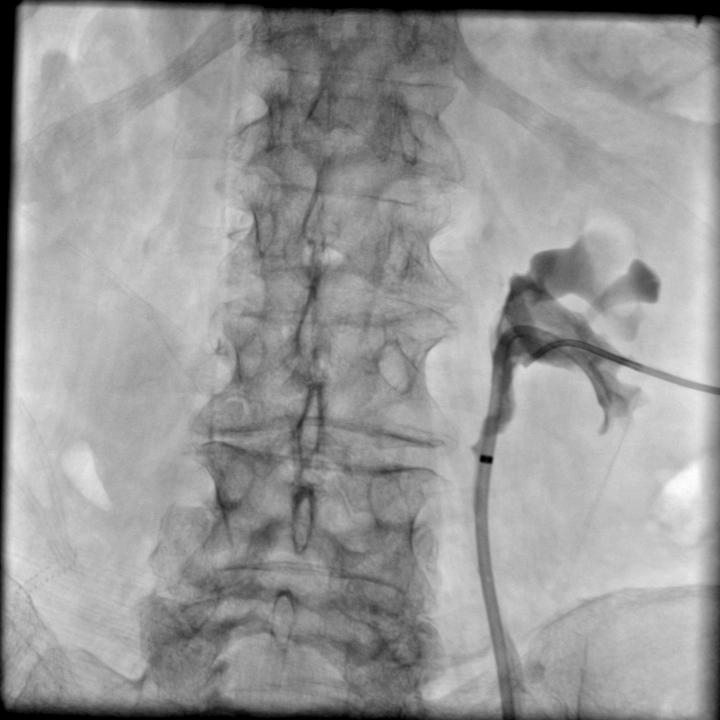
[im 3/3]
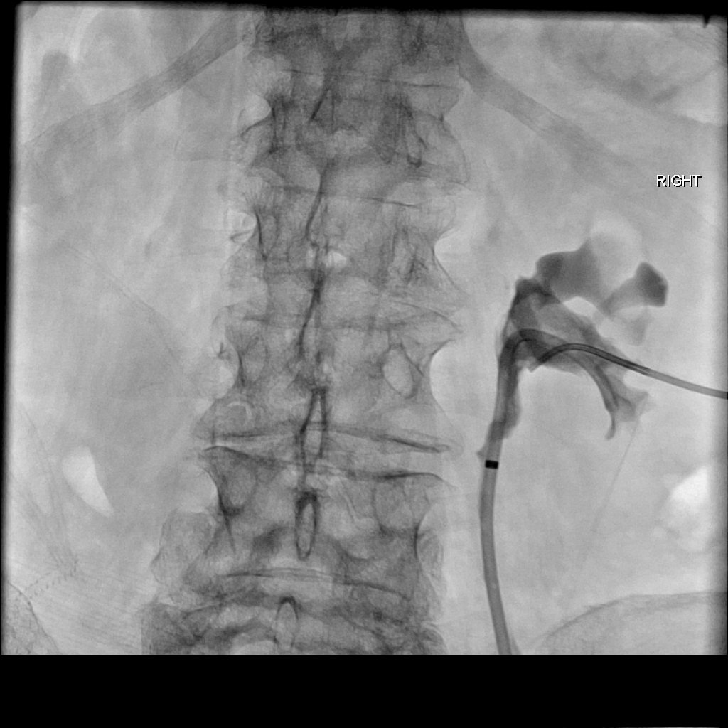

[Series 4: single · 2 of 2 slices shown (3 of 3)]
[im 1/2]
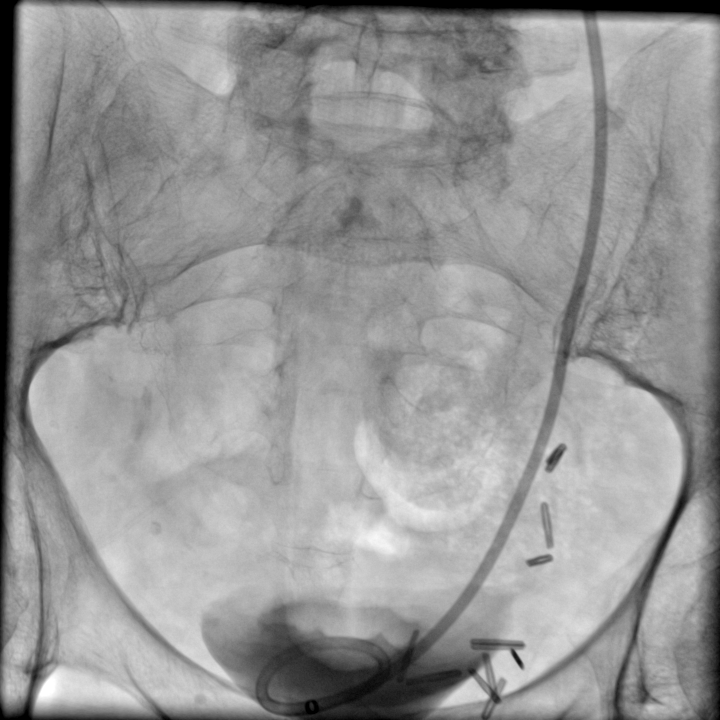
[im 2/2]
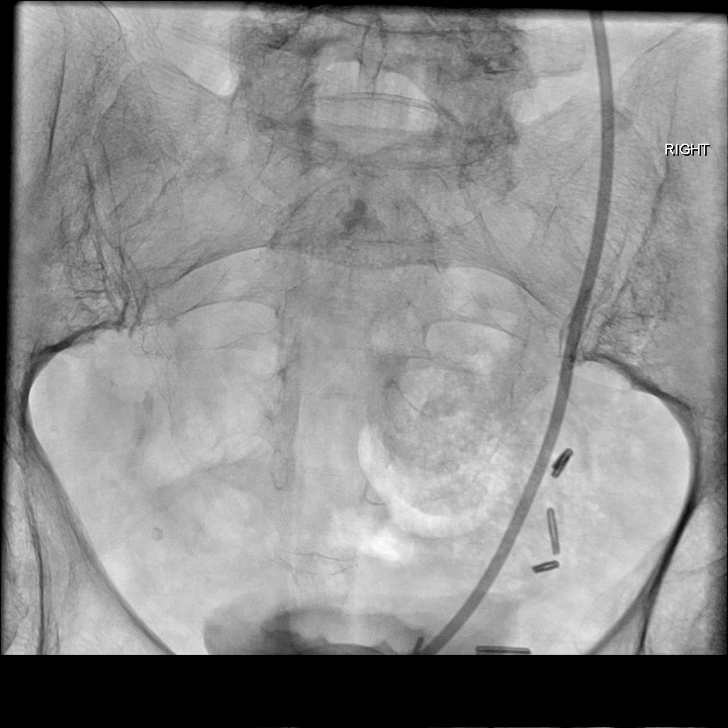

[Series 5: fl - angio · 1 of 1 slices shown (2 of 2)]
[im 1/1]
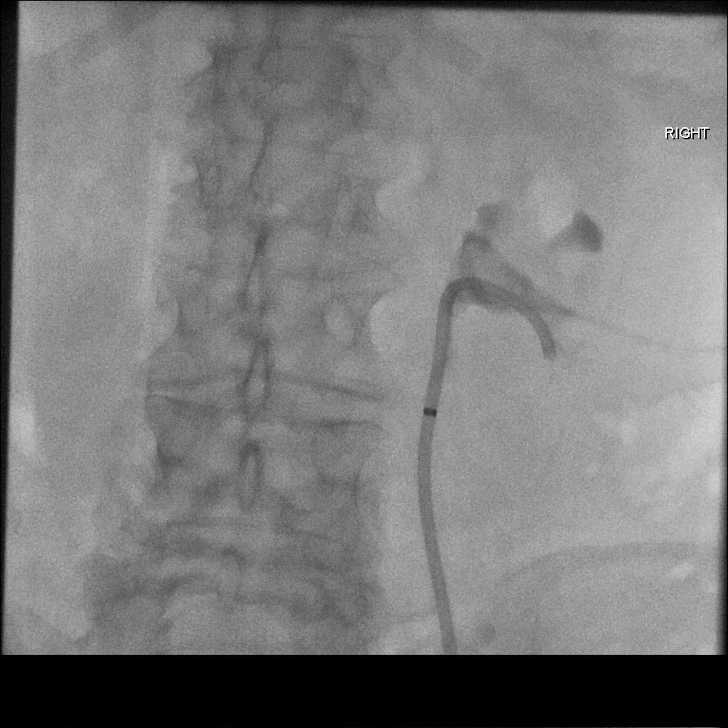

[9 of 9 positions shown; findings below may reference images not displayed]

MEDICATIONS:
Ciprofloxacin 400 mg; The antibiotic was administered in an
appropriate time frame prior to skin puncture.

ANESTHESIA/SEDATION:
Fentanyl 25 mcg IV; Versed 1.0 mg IV

Moderate Sedation Time:  20 minutes

The patient was continuously monitored during the procedure by the
interventional radiology nurse under my direct supervision.

CONTRAST:  20 mL - administered into the collecting system(s)

FLUOROSCOPY TIME:  Fluoroscopy Time: 6 minutes and 24 seconds,
mGy

COMPLICATIONS:
None immediate.

PROCEDURE:
Informed written consent was obtained from the patient after a
thorough discussion of the procedural risks, benefits and
alternatives. All questions were addressed. Maximal Sterile Barrier
Technique was utilized including caps, mask, sterile gowns, sterile
gloves, sterile drape, hand hygiene and skin antiseptic. A timeout
was performed prior to the initiation of the procedure.

Patient was placed prone. The right flank and right nephrostomy tube
were prepped and draped in sterile fashion. Contrast was injected
through the nephrostomy tube. The nephrostomy tube was cut and
removed over a Bentson wire. The 5 French Kumpe catheter was
successfully advanced beyond the stone into the right ureter using a
Glidewire. Catheter was advanced down to the bladder. A stiff
Amplatz wire was placed. A 7 French sheath was advanced over the
Amplatz wire in order to place a safety wire within the right renal
collecting system. A8rench sheath was removed. A 22 cm x 8 French
ureteral stent was advanced over the Amplatz wire. The distal aspect
was reconstituted in the bladder. Proximal aspect was placed in the
lower pole calyx. Proximal aspect was unable to reconstitute due to
the renal pelvic stone. 5 French catheter was advanced over the
safety wire and an antegrade nephrostogram was performed. The renal
collecting system was adequately draining through the ureter stent.
Therefore, the nephrostomy tube access was completely removed.
Bandage placed over the nephrostomy tube site.
FINDINGS: Large stone occupying the renal pelvis. Right ureter stent is well
positioned in the right ureter. Distal aspect is reconstitution in
the bladder. Proximal aspect of the stent extends into the lower
pole calyx.
IMPRESSION: Successful conversion of the right percutaneous nephrostomy tube for
a right ureteral stent.

Removal of the right nephrostomy tube access.

## 2021-02-05 DEATH — deceased
